# Patient Record
Sex: Female | Born: 1970 | Race: White | Hispanic: No | Smoking: Current every day smoker
Health system: Southern US, Community
[De-identification: ages and names within clinical notes are randomized; demographics above are authoritative.]

## PROBLEM LIST (undated history)

## (undated) DIAGNOSIS — F988 Other specified behavioral and emotional disorders with onset usually occurring in childhood and adolescence: Secondary | ICD-10-CM

## (undated) DIAGNOSIS — F32A Depression, unspecified: Secondary | ICD-10-CM

## (undated) DIAGNOSIS — M1990 Unspecified osteoarthritis, unspecified site: Secondary | ICD-10-CM

## (undated) DIAGNOSIS — F319 Bipolar disorder, unspecified: Secondary | ICD-10-CM

## (undated) DIAGNOSIS — F410 Panic disorder [episodic paroxysmal anxiety] without agoraphobia: Secondary | ICD-10-CM

## (undated) DIAGNOSIS — I10 Essential (primary) hypertension: Secondary | ICD-10-CM

## (undated) HISTORY — PX: CHOLECYSTECTOMY: SHX55

## (undated) HISTORY — PX: ABDOMINAL HYSTERECTOMY: SHX81

## (undated) HISTORY — DX: Depression, unspecified: F32.A

---

## 2002-07-25 ENCOUNTER — Other Ambulatory Visit: Admission: RE | Admit: 2002-07-25 | Discharge: 2002-07-25 | Payer: Self-pay | Admitting: Internal Medicine

## 2002-12-02 ENCOUNTER — Emergency Department (HOSPITAL_COMMUNITY): Admission: EM | Admit: 2002-12-02 | Discharge: 2002-12-02 | Payer: Self-pay

## 2004-06-24 ENCOUNTER — Emergency Department: Payer: Self-pay | Admitting: Emergency Medicine

## 2005-06-28 ENCOUNTER — Emergency Department: Payer: Self-pay | Admitting: General Practice

## 2006-09-22 ENCOUNTER — Ambulatory Visit: Payer: Self-pay

## 2006-11-15 ENCOUNTER — Ambulatory Visit: Payer: Self-pay

## 2007-04-11 ENCOUNTER — Emergency Department: Payer: Self-pay | Admitting: Emergency Medicine

## 2007-04-22 ENCOUNTER — Emergency Department: Payer: Self-pay | Admitting: Emergency Medicine

## 2007-05-07 ENCOUNTER — Emergency Department: Payer: Self-pay | Admitting: Emergency Medicine

## 2007-05-16 ENCOUNTER — Ambulatory Visit: Payer: Self-pay | Admitting: Family Medicine

## 2007-05-30 ENCOUNTER — Ambulatory Visit: Payer: Self-pay | Admitting: Pain Medicine

## 2007-06-14 ENCOUNTER — Ambulatory Visit: Payer: Self-pay | Admitting: Pain Medicine

## 2007-07-11 ENCOUNTER — Ambulatory Visit: Payer: Self-pay | Admitting: Physician Assistant

## 2007-08-23 ENCOUNTER — Other Ambulatory Visit: Payer: Self-pay

## 2007-08-23 ENCOUNTER — Inpatient Hospital Stay: Payer: Self-pay | Admitting: Internal Medicine

## 2007-09-01 ENCOUNTER — Ambulatory Visit: Payer: Self-pay | Admitting: Physician Assistant

## 2007-12-20 ENCOUNTER — Emergency Department: Payer: Self-pay | Admitting: Emergency Medicine

## 2007-12-21 ENCOUNTER — Inpatient Hospital Stay: Payer: Self-pay | Admitting: Psychiatry

## 2009-02-19 IMAGING — US US EXTREM LOW VENOUS*L*
1 series · 17 of 20 positions shown · non-contrast
Comparison: none

REASON FOR EXAM: LEFT lower extremity edema pain  eval DVT
COMMENTS:  Call report Dr Giannopoulos [phone]

PROCEDURE:     US  - US DOPPLER LOW EXTR LEFT  - September 22, 2006  [DATE]
RESULT:     The phasic and augmentation flow waveforms are normal.  The LEFT
femoral and popliteal veins show normal compressibility.  Doppler
examination shows no deep venous occlusion.

[Series 1: us extrem low venous*left* · 17 of 20 slices shown]
[im 1/20]
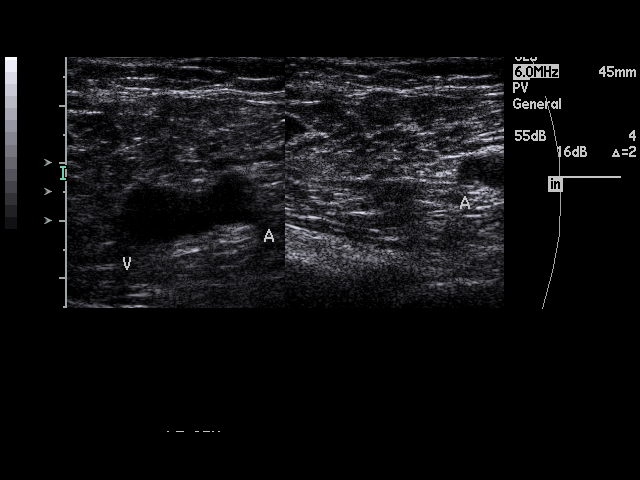
[im 2/20]
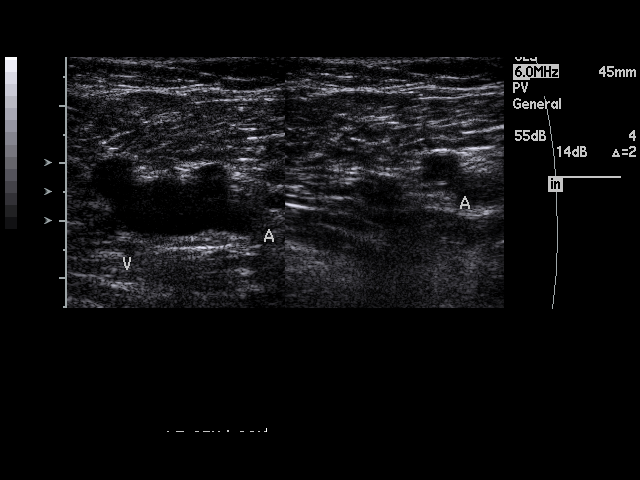
[im 3/20]
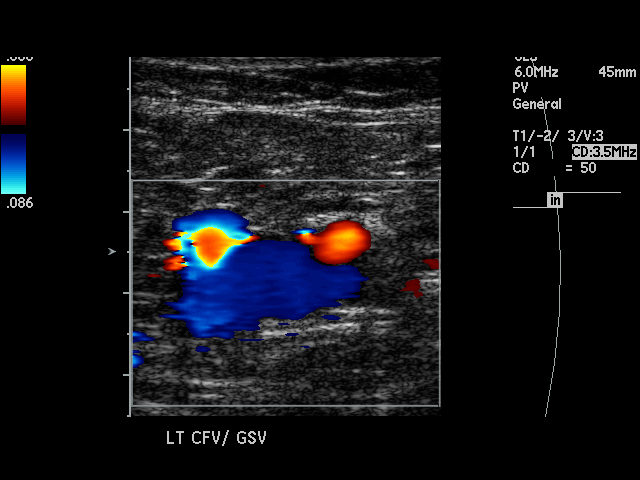
[im 5/20]
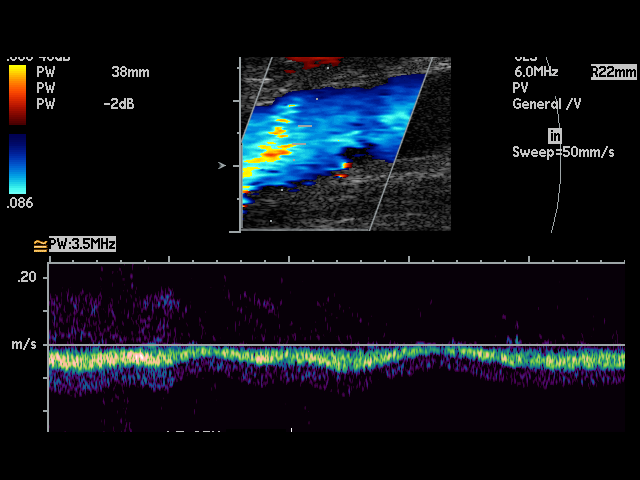
[im 6/20]
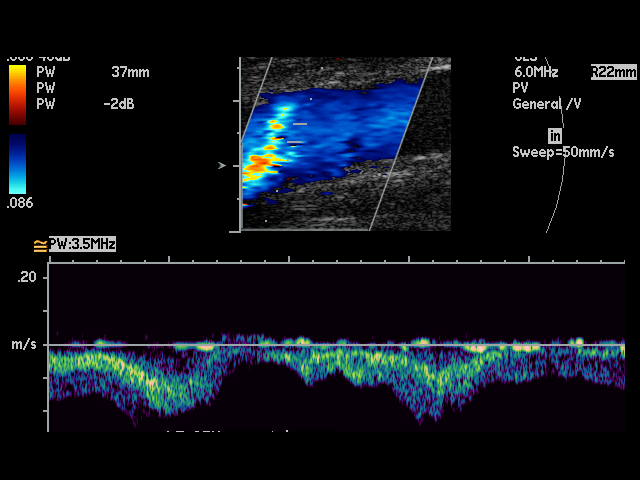
[im 7/20]
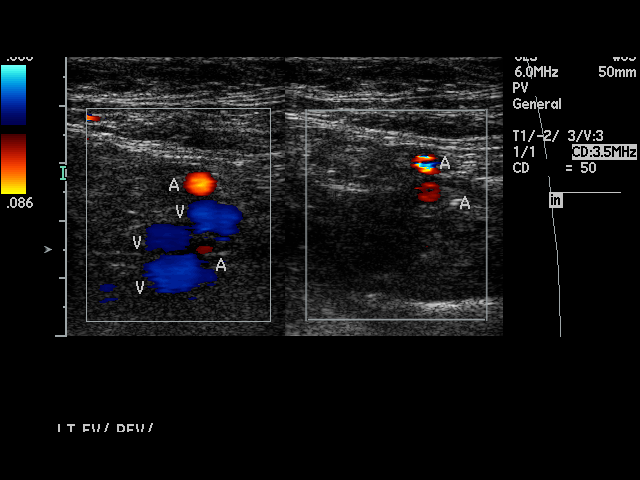
[im 8/20]
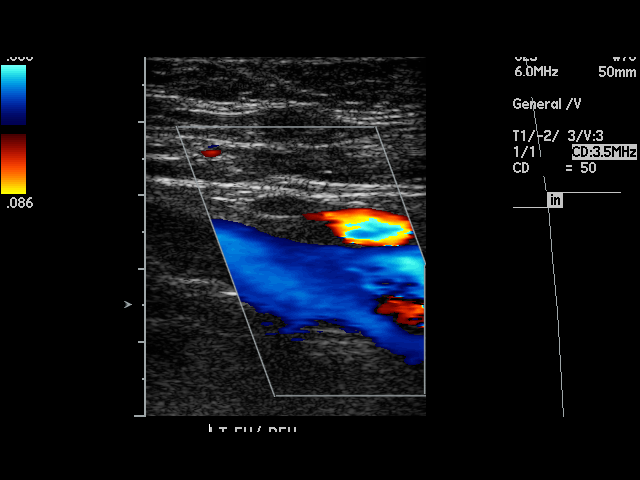
[im 9/20]
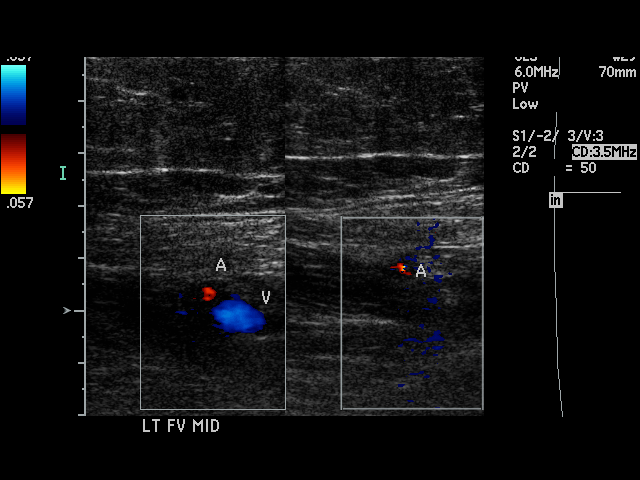
[im 11/20]
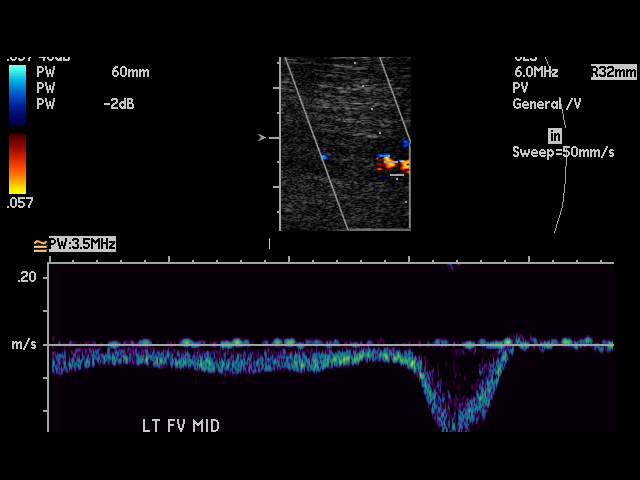
[im 12/20]
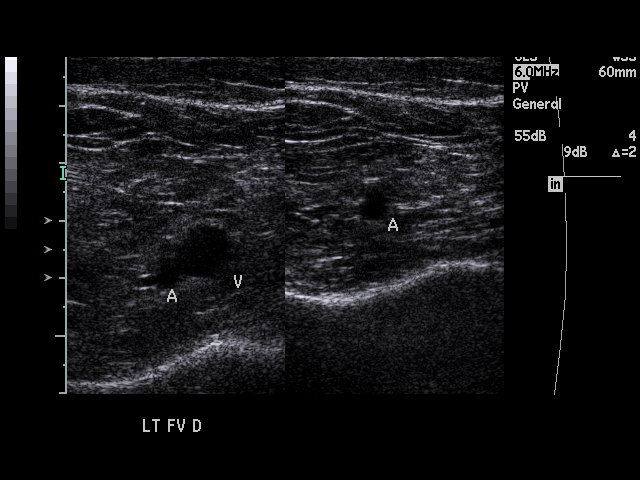
[im 13/20]
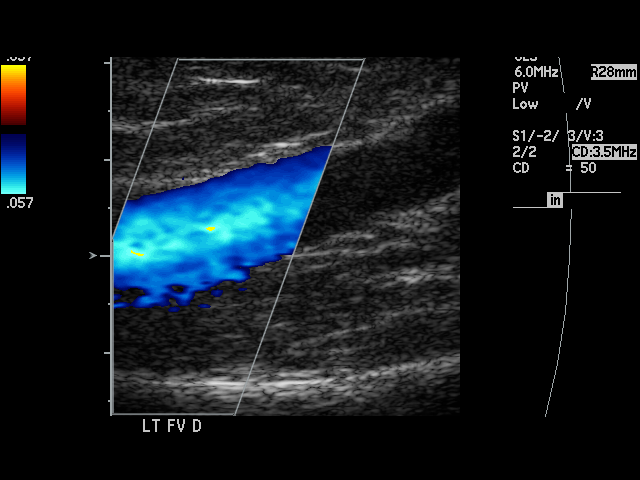
[im 14/20]
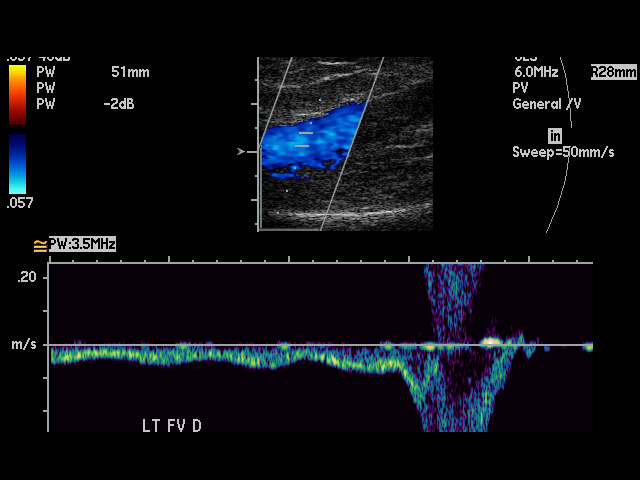
[im 15/20]
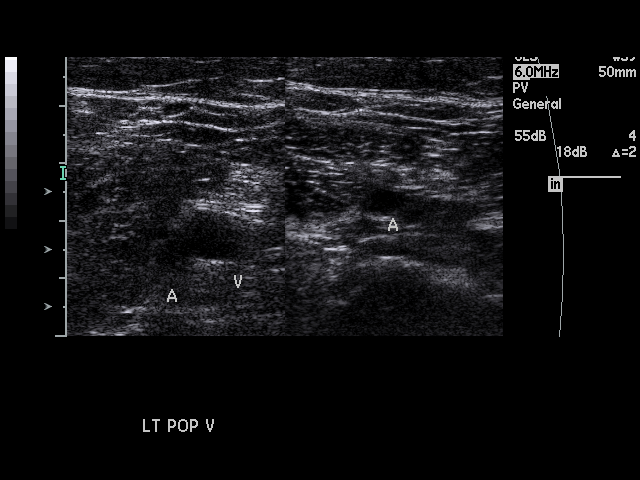
[im 16/20]
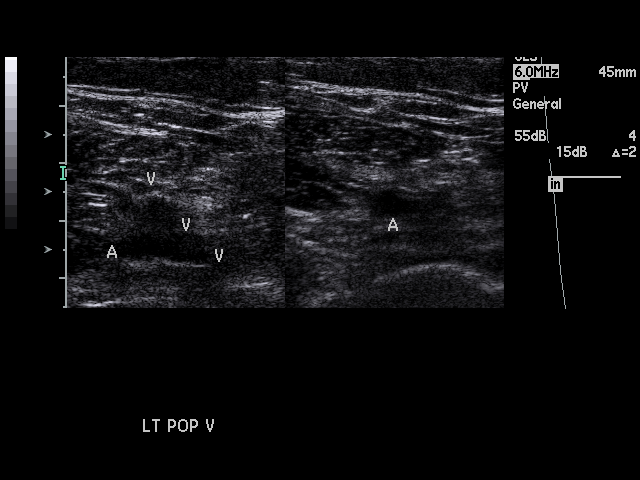
[im 18/20]
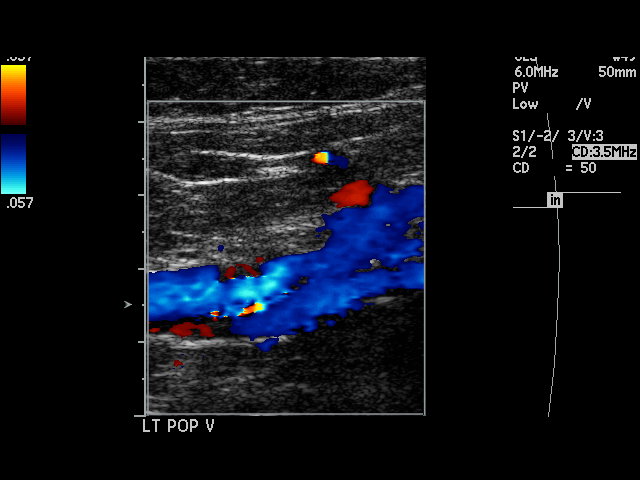
[im 19/20]
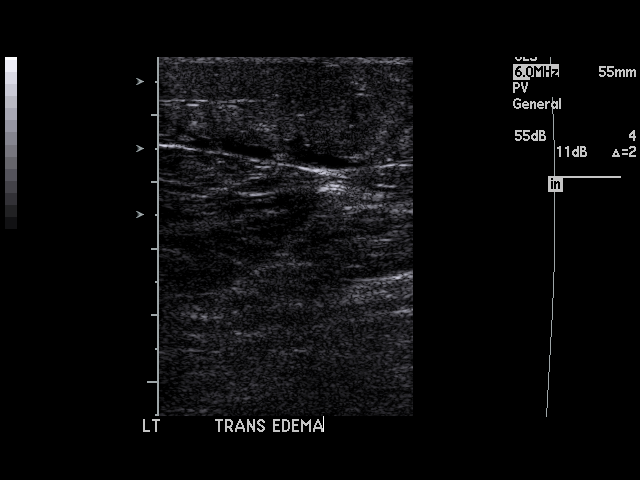
[im 20/20]
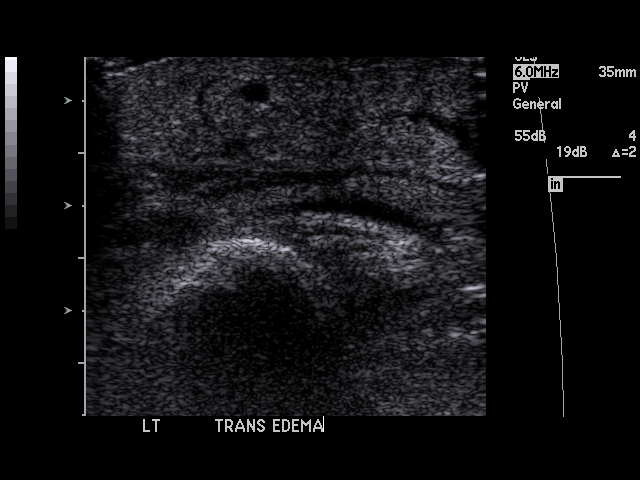

[17 of 20 positions shown; findings below may reference images not displayed]

IMPRESSION: 1.     Normal study.
2.     No deep venous thrombosis is identified in the LEFT lower extremity.

## 2009-02-22 ENCOUNTER — Inpatient Hospital Stay: Payer: Self-pay | Admitting: *Deleted

## 2009-04-18 ENCOUNTER — Ambulatory Visit: Payer: Self-pay | Admitting: Internal Medicine

## 2009-05-05 ENCOUNTER — Emergency Department: Payer: Self-pay | Admitting: Emergency Medicine

## 2009-07-10 ENCOUNTER — Encounter: Payer: Self-pay | Admitting: Anesthesiology

## 2009-08-06 ENCOUNTER — Encounter: Payer: Self-pay | Admitting: Anesthesiology

## 2009-10-02 ENCOUNTER — Emergency Department: Payer: Self-pay | Admitting: Emergency Medicine

## 2010-03-10 ENCOUNTER — Emergency Department: Payer: Self-pay | Admitting: Unknown Physician Specialty

## 2010-05-04 ENCOUNTER — Inpatient Hospital Stay: Payer: Self-pay | Admitting: Psychiatry

## 2011-05-13 ENCOUNTER — Inpatient Hospital Stay: Payer: Self-pay | Admitting: Psychiatry

## 2011-06-05 ENCOUNTER — Emergency Department: Payer: Self-pay | Admitting: Internal Medicine

## 2011-07-15 ENCOUNTER — Emergency Department: Payer: Self-pay | Admitting: Emergency Medicine

## 2011-07-29 ENCOUNTER — Emergency Department (HOSPITAL_COMMUNITY)
Admission: EM | Admit: 2011-07-29 | Discharge: 2011-07-29 | Disposition: A | Discharge status: Home / Self Care | Payer: Medicaid Other | Attending: Emergency Medicine | Admitting: Emergency Medicine

## 2011-07-29 ENCOUNTER — Encounter (HOSPITAL_COMMUNITY): Payer: Self-pay

## 2011-07-29 ENCOUNTER — Emergency Department (HOSPITAL_COMMUNITY): Payer: Medicaid Other

## 2011-07-29 DIAGNOSIS — R29898 Other symptoms and signs involving the musculoskeletal system: Secondary | ICD-10-CM | POA: Insufficient documentation

## 2011-07-29 DIAGNOSIS — M779 Enthesopathy, unspecified: Secondary | ICD-10-CM

## 2011-07-29 DIAGNOSIS — M5430 Sciatica, unspecified side: Secondary | ICD-10-CM | POA: Insufficient documentation

## 2011-07-29 DIAGNOSIS — M545 Low back pain, unspecified: Secondary | ICD-10-CM | POA: Insufficient documentation

## 2011-07-29 DIAGNOSIS — M79609 Pain in unspecified limb: Secondary | ICD-10-CM | POA: Insufficient documentation

## 2011-07-29 DIAGNOSIS — R32 Unspecified urinary incontinence: Secondary | ICD-10-CM | POA: Insufficient documentation

## 2011-07-29 DIAGNOSIS — M5432 Sciatica, left side: Secondary | ICD-10-CM

## 2011-07-29 DIAGNOSIS — R209 Unspecified disturbances of skin sensation: Secondary | ICD-10-CM | POA: Insufficient documentation

## 2011-07-29 DIAGNOSIS — M898X9 Other specified disorders of bone, unspecified site: Secondary | ICD-10-CM | POA: Insufficient documentation

## 2011-07-29 MED ORDER — KETOROLAC TROMETHAMINE 60 MG/2ML IM SOLN: 60.0000 mg | Freq: Once | INTRAMUSCULAR | Status: DC

## 2011-07-29 MED ORDER — KETOROLAC TROMETHAMINE 60 MG/2ML IM SOLN
30.0000 mg | Freq: Once | INTRAMUSCULAR | Status: AC
  Administered 2011-07-29: 30 mg via INTRAMUSCULAR
  Filled 2011-07-29: qty 2

## 2011-07-29 MED ORDER — OXYCODONE-ACETAMINOPHEN 5-325 MG PO TABS
1.0000 | ORAL_TABLET | Freq: Once | ORAL | Status: DC
  Filled 2011-07-29: qty 1

## 2011-07-29 MED ORDER — OXYCODONE-ACETAMINOPHEN 5-325 MG PO TABS: 2.0000 | ORAL_TABLET | ORAL | Status: AC | PRN

## 2011-07-29 NOTE — ED Provider Notes (Signed)
History     CSN: [id number]  Arrival date & time 07/29/11  0820   First MD Initiated Contact with Patient 07/29/11 [phone]      Chief Complaint  Patient presents with  . Foot Pain    (Consider location/radiation/quality/duration/timing/severity/associated sxs/prior treatment) HPI History provided by pt.   Pt c/o pain in left foot x approx 2 weeks.  Reports that pain is located in ball of foot but has padding on her heel and was recently given a clinical diagnosis of heel spur.  Only occurs when she is bearing weight.  Denies trauma.  Onset acute exacerbation of stabbing left sciatica, from mid-line low back down lateral aspect of LLE, at same time.  She is unsure if pain in foot is radiated from back or unrelated.  Back pain associated w/ weakness in LLE that has resulted in falls, urinary incontinence and paresthesias.  Has had these sx w/ past exacerbations as well, the most recent being approx 1 year ago.  No fever.   Has been taking aleve and motrin w/out relief and these medications are causing her to have diarrhea and hemorrhoids.    No past medical history on file.  No past surgical history on file.  No family history on file.  History  Substance Use Topics  . Smoking status: Current Everyday Smoker  . Smokeless tobacco: Not on file  . Alcohol Use: No    OB History    Grav Para Term Preterm Abortions TAB SAB Ect Mult Living                  Review of Systems  All other systems reviewed and are negative.    Allergies  Review of patient's allergies indicates no known allergies.  Home Medications   Current Outpatient Rx  Name Route Sig Dispense Refill  . ALPRAZOLAM 1 MG PO TABS Oral Take 1 mg by mouth 3 (three) times daily as needed. Stomach nerves    . IBUPROFEN 200 MG PO TABS Oral Take 400 mg by mouth every 6 (six) hours as needed. For pain    . LISDEXAMFETAMINE DIMESYLATE 30 MG PO CAPS Oral Take 60 mg by mouth See admin instructions. Just on days she goes to  school    . NAPROXEN SODIUM 220 MG PO TABS Oral Take 220 mg by mouth 3 (three) times daily as needed. For pain    . OVER THE COUNTER MEDICATION Topical Apply 1 patch topically daily as needed. Foot pain    . PE-SHARK LIVER OIL-COCOA BUTTR 0.25-3-85.5 % RE SUPP Rectal Place 1 suppository rectally as needed. hemmeroids      BP 137/84  Pulse 102  Temp(Src) 98 F (36.7 C) (Oral)  Resp 18  Ht 5\' 5"  (1.651 m)  Wt 220 lb (99.791 kg)  BMI 36.61 kg/m2  SpO2 100%  Physical Exam  Nursing note and vitals reviewed. Constitutional: She is oriented to person, place, and time. She appears well-developed and well-nourished. No distress.  HENT:  Head: Normocephalic and atraumatic.  Eyes:       Normal appearance  Neck: Normal range of motion.  Cardiovascular: Normal rate, regular rhythm and intact distal pulses.   Pulmonary/Chest: Effort normal and breath sounds normal.  Musculoskeletal: Normal range of motion.       Entire lumbar spine mildly ttp. No CVA ttp. Full ROM of LE.  Nml patellar reflexes.  Normal rectal tone.  No saddle anesthesia. Distal sensation intact.  2+ DP pulses.  Pt has a  padded material on plantar surface of heel.  No skin changes. Entire foot non-tender.  No pain w/ passive ROM of ankle.  Active ROM of all toes.    Neurological: She is alert and oriented to person, place, and time. She has normal reflexes. She displays no tremor. No cranial nerve deficit or sensory deficit. She displays a negative Romberg sign. Coordination and gait normal.       5/5 and equal upper and lower extremity strength.  No past pointing.  No pronator drift.  No nystagmus.   Skin: Skin is warm and dry. No rash noted.  Psychiatric: She has a normal mood and affect. Her behavior is normal.    ED Course  Procedures (including critical care time)  Labs Reviewed - No data to display Dg Foot Complete Left  07/29/2011  *RADIOLOGY REPORT*  Clinical Data: Heel pain.  No injury.  LEFT FOOT - COMPLETE 3+ VIEW   Comparison: None.  Findings: Plantar calcaneal spur present. No acute bony abnormality.  Specifically, no fracture, subluxation, or dislocation.  Soft tissues are intact.  IMPRESSION: Plantar calcaneal spur.  No acute findings.  Original Report Authenticated By: Cyndie Chime, M.D.     1. Sciatica of left side   2. Bone spur       MDM  Pt presents w/ cc non-traumatic pain plantar surface left foot when she bears weight x 2 wks.  PCP clinically diagnosed her w/ bone spur.  Onset left sciatica exacerbation simultaneously and she is unsure if foot pain is related.  Subjective urinary incontinence and LLE weakness but on exam, she has 5/5 strength, no sensory deficits (including saddle anesthesia) and normal rectal tone.  Low suspicion for cauda equina syndrome.  Pt is receiving IM toradol.Will reassess shortly.    Xray of foot shows plantar calcaneal spur.  This or less likely sciatica may be responsible for foot pain.  Discussed w/ pt. She reports feeling better after receiving toradol and she is ambulating w/out difficulty.  Nursing staff provided pt w/ an ace bandage as requested.  Prescribed percocet.  Recommended that she continue her NSAID, avoid activities that aggravate pain and f/u with neuro and/or ortho for back and persistent foot pain.  Gave her contact information for a pain specialist as well. Return precautions discussed.  11:05 AM         Otilio Miu, PA 07/29/11 1110

## 2011-07-30 NOTE — ED Provider Notes (Signed)
Medical screening examination/treatment/procedure(s) were performed by non-physician practitioner and as supervising physician I was immediately available for consultation/collaboration.  Fabiana Dromgoole, MD 07/30/11 0750 

## 2011-08-12 ENCOUNTER — Ambulatory Visit: Payer: Medicaid Other | Admitting: Physical Therapy

## 2011-09-28 ENCOUNTER — Inpatient Hospital Stay: Payer: Self-pay | Admitting: Unknown Physician Specialty

## 2011-09-28 LAB — SALICYLATE LEVEL: Salicylates, Serum: 2.4 mg/dL

## 2011-09-28 LAB — URINALYSIS, COMPLETE
Bilirubin,UR: NEGATIVE
Glucose,UR: NEGATIVE mg/dL (ref 0–75)
Hyaline Cast: 3
Leukocyte Esterase: NEGATIVE
Ph: 5 (ref 4.5–8.0)
Specific Gravity: 1.01 (ref 1.003–1.030)

## 2011-09-28 LAB — COMPREHENSIVE METABOLIC PANEL
BUN: 8 mg/dL (ref 7–18)
Co2: 31 mmol/L (ref 21–32)
Creatinine: 0.89 mg/dL (ref 0.60–1.30)
EGFR (Non-African Amer.): >60
Glucose: 115 mg/dL — ABNORMAL HIGH (ref 65–99)
SGOT(AST): 42 U/L — ABNORMAL HIGH (ref 15–37)
Sodium: 143 mmol/L (ref 136–145)
Total Protein: 7.2 g/dL (ref 6.4–8.2)

## 2011-09-28 LAB — TSH: Thyroid Stimulating Horm: 3.38 u[IU]/mL

## 2011-09-28 LAB — CBC
HCT: 37.2 % (ref 35.0–47.0)
HGB: 12.3 g/dL (ref 12.0–16.0)
MCH: 26.5 pg (ref 26.0–34.0)
RBC: 4.64 10*6/uL (ref 3.80–5.20)
RDW: 15.1 % — ABNORMAL HIGH (ref 11.5–14.5)

## 2011-09-28 LAB — DRUG SCREEN, URINE
Barbiturates, Ur Screen: NEGATIVE (ref ?–200)
Cocaine Metabolite,Ur ~~LOC~~: NEGATIVE (ref ?–300)
Phencyclidine (PCP) Ur S: NEGATIVE (ref ?–25)

## 2011-09-28 LAB — PREGNANCY, URINE: Pregnancy Test, Urine: NEGATIVE m[IU]/mL

## 2011-10-14 ENCOUNTER — Emergency Department: Payer: Self-pay | Admitting: Emergency Medicine

## 2011-11-13 ENCOUNTER — Encounter (HOSPITAL_COMMUNITY): Payer: Self-pay

## 2011-11-13 ENCOUNTER — Emergency Department (HOSPITAL_COMMUNITY)
Admission: EM | Admit: 2011-11-13 | Discharge: 2011-11-13 | Disposition: A | Discharge status: Home / Self Care | Payer: Medicaid Other | Attending: Emergency Medicine | Admitting: Emergency Medicine

## 2011-11-13 ENCOUNTER — Emergency Department (HOSPITAL_COMMUNITY): Payer: Medicaid Other

## 2011-11-13 DIAGNOSIS — Z79899 Other long term (current) drug therapy: Secondary | ICD-10-CM | POA: Insufficient documentation

## 2011-11-13 DIAGNOSIS — W1800XA Striking against unspecified object with subsequent fall, initial encounter: Secondary | ICD-10-CM

## 2011-11-13 DIAGNOSIS — M25559 Pain in unspecified hip: Secondary | ICD-10-CM | POA: Insufficient documentation

## 2011-11-13 DIAGNOSIS — M545 Low back pain, unspecified: Secondary | ICD-10-CM

## 2011-11-13 DIAGNOSIS — R202 Paresthesia of skin: Secondary | ICD-10-CM

## 2011-11-13 DIAGNOSIS — I10 Essential (primary) hypertension: Secondary | ICD-10-CM | POA: Insufficient documentation

## 2011-11-13 DIAGNOSIS — R209 Unspecified disturbances of skin sensation: Secondary | ICD-10-CM | POA: Insufficient documentation

## 2011-11-13 DIAGNOSIS — F988 Other specified behavioral and emotional disorders with onset usually occurring in childhood and adolescence: Secondary | ICD-10-CM | POA: Insufficient documentation

## 2011-11-13 DIAGNOSIS — Y9229 Other specified public building as the place of occurrence of the external cause: Secondary | ICD-10-CM | POA: Insufficient documentation

## 2011-11-13 HISTORY — DX: Other specified behavioral and emotional disorders with onset usually occurring in childhood and adolescence: F98.8

## 2011-11-13 HISTORY — DX: Panic disorder (episodic paroxysmal anxiety): F41.0

## 2011-11-13 HISTORY — DX: Essential (primary) hypertension: I10

## 2011-11-13 MED ORDER — HYDROCODONE-ACETAMINOPHEN 5-325 MG PO TABS: 1.0000 | ORAL_TABLET | Freq: Four times a day (QID) | ORAL | Status: AC | PRN

## 2011-11-13 MED ORDER — HYDROCODONE-ACETAMINOPHEN 5-325 MG PO TABS
1.0000 | ORAL_TABLET | Freq: Once | ORAL | Status: AC
  Administered 2011-11-13: 1 via ORAL
  Filled 2011-11-13: qty 1

## 2011-11-13 NOTE — ED Notes (Signed)
Patient ambulatory in department.
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
## 2011-11-13 NOTE — ED Provider Notes (Signed)
Medical screening examination/treatment/procedure(s) were performed by non-physician practitioner and as supervising physician I was immediately available for consultation/collaboration.  Doug Sou, MD 11/13/11 4540

## 2011-11-13 NOTE — Discharge Instructions (Signed)
Please read the information below.  Continue to take aleve in addition to the prescribed pain medication while you heal.  You may also try cold compresses on the areas that are most painful.  If you continue to have significant pain next week, please see your primary care provider.  If you develop uncontrolled pain, loss of control of bowel or bladder or are unable to urinate, have weakness in your legs or are unable to walk, go to your nearest emergency department immediately.  You may return to the ER at any time for worsening condition or any new symptoms that concern you.   Back Pain, Adult Back pain is very common. The pain often gets better over time. The cause of back pain is usually not dangerous. Most people can learn to manage their back pain on their own.  HOME CARE   Stay active. Start with short walks on flat ground if you can. Try to walk farther each day.   Do not sit, drive, or stand in one place for more than 30 minutes. Do not stay in bed.   Do not avoid exercise or work. Activity can help your back heal faster.   Be careful when you bend or lift an object. Bend at your knees, keep the object close to you, and do not twist.   Sleep on a firm mattress. Lie on your side, and bend your knees. If you lie on your back, put a pillow under your knees.   Only take medicines as told by your doctor.   Put ice on the injured area.   Put ice in a plastic bag.   Place a towel between your skin and the bag.   Leave the ice on for 15 to 20 minutes, 3 to 4 times a day for the first 2 to 3 days. After that, you can switch between ice and heat packs.   Ask your doctor about back exercises or massage.   Avoid feeling anxious or stressed. Find good ways to deal with stress, such as exercise.  GET HELP RIGHT AWAY IF:   Your pain does not go away with rest or medicine.   Your pain does not go away in 1 week.   You have new problems.   You do not feel well.   The pain spreads into  your legs.   You cannot control when you poop (bowel movement) or pee (urinate).   Your arms or legs feel weak or lose feeling (numbness).   You feel sick to your stomach (nauseous) or throw up (vomit).   You have belly (abdominal) pain.   You feel like you may pass out (faint).  MAKE SURE YOU:   Understand these instructions.   Will watch your condition.   Will get help right away if you are not doing well or get worse.  Document Released: 12/09/2007 Document Revised: 06/11/2011 Document Reviewed: 11/10/2010 [facility] Patient Information 2012 [place], [place].  Paresthesia Paresthesia is a burning or prickling feeling. This feeling can happen in any part of the body. It often happens in the hands, arms, legs, or feet. HOME CARE  Avoid drinking alcohol.   Try massage or needle therapy (acupuncture) to help with your problems.   Keep all doctor visits as told.  GET HELP RIGHT AWAY IF:   You feel weak.   You have trouble walking or moving.   You have problems speaking or seeing.   You feel confused.   You cannot control when you  poop (bowel movement) or pee (urinate).   You lose feeling (numbness) after an injury.   You pass out (faint).   Your burning or prickling feeling gets worse when you walk.   You have pain, cramps, or feel dizzy.   You have a rash.  MAKE SURE YOU:   Understand these instructions.   Will watch your condition.   Will get help right away if you are not doing well or get worse.  Document Released: 06/04/2008 Document Revised: 06/11/2011 Document Reviewed: 03/13/2011 [facility] Patient Information 2012 [place], [place].  If you have no primary doctor, here are some resources that may be helpful:  Medicaid-accepting [facility] Providers:   - [facility]- [address]      [phone]      Mon-Fri 9am-7pm, Sat 9am-1pm   - [facility]- [address] [place]      [phone]   -  [facility]- [address] [place]      [phone]   [facility]- [address]      [phone]   - Renaye Rakers- [address] [place], [address]      [phone]      Only accepts [place] patients       after they have her name applied to their card   Self Pay (no insurance) in [place]:   - Sickle Cell Patients: Dr Willey Blade, [facility]      [address] [place]      [phone]   - Health [phone]   - Physician Referral Service- [phone]   - [facility]- [address] [place]      [phone]   [facility] [place]- [address]   - [facility]- see information above      (Speak to [employer] if you do not have insurance)   - [facility]- [address] [place]      [phone]   - [facility] [place]- [address]      [phone]   - [facility]- [address]      [phone]   - Dr Julio Sicks-  [address], [place]      [phone]   - [facility]- [address]      [phone]   - [facility]- [address]      [phone]      Also [address]      [phone]   - [facility]- [address]      [phone]      1st and 3rd Saturday every month, 10am-1pm Other agencies that provide inexpensive medical care:    [facility]  [phone]    [facility]  [phone]    [facility]  [phone]    [facility]  [phone]    [facility]  [phone]  General Information: Finding a doctor when you do not have health insurance can be tricky. Although you are not limited by an insurance plan, you are of course limited by her finances and how much but he can pay out of pocket.  What are your options if you don't have health insurance?   1) Find a Librarian, academic and Pay Out  of Pocket Although you won't have to find out who is  covered by your insurance plan, it is a good idea to ask around and get recommendations. You will then need to call the office and see if the doctor you have chosen will accept you as a new patient and what types of options they offer for patients who are self-pay. Some doctors offer discounts or will set up payment plans for their patients who do not have insurance, but you will need to ask so you aren't surprised when you get to your appointment.  2) Contact Your Local Health Department Not all health departments have doctors that can see patients for sick visits, but many do, so it is worth a call to see if yours does. If you don't know where your local health department is, you can check in your phone book. The CDC also has a tool to help you locate your state's health department, and many state websites also have listings of all of their local health departments.  3) Find a Walk-in Clinic If your illness is not likely to be very severe or complicated, you may want to try a walk in clinic. These are popping up all over the country in pharmacies, drugstores, and shopping centers. They're usually staffed by nurse practitioners or physician assistants that have been trained to treat common illnesses and complaints. They're usually fairly quick and inexpensive. However, if you have serious medical issues or chronic medical problems, these are probably not your best option;  RESOURCE GUIDE  Dental Problems  Patients with Medicaid: [facility] [phone]                                           [phone] [address] [employer] Phone:  [phone]                                                  Phone:  [phone]  If unable to pay or uninsured, contact:  [facility] or [facility]. to become qualified for the adult dental clinic.  Chronic Pain Problems Contact [facility]  [phone] Patients need to be referred by  their primary care doctor.  Insufficient Money for Medicine Contact [employer]:  call "211" or [facility] [phone].  No Primary Care Doctor Call [facility]  [phone] Other agencies that provide inexpensive medical care    [facility]  [phone]    [facility]  [phone]    [facility]  [phone]    [facility]  [phone]    [facility]  [phone]    [facility]  [phone]  Psychological Services [facility]  [phone] [facility] Services  [phone] [facility]   [phone] (emergency services [phone])  Substance Abuse Resources Alcohol and Drug Services  [phone] [facility] [phone] [facility] [place] [phone] Floydene Flock [phone] Residential & Outpatient Substance Abuse Program  [phone]  Abuse/Neglect [facility] Child Abuse Hotline (703)189-9044 [facility] Child Abuse Hotline 857-385-5526 (After Hours)  Emergency Shelter  [employer] [phone]  Maternity Homes [facility] at [facility] [place] 850-653-6787 [facility] [phone]  MRSA Hotline #:   (279)421-6166    [facility] Resources  [facility] [place]     [employer]                          [facility] Dept. [address] [place]                       [address]      [address]  Blondell Reveal Phone:  [phone]                                   Phone:  [phone]                 Phone:  [phone]  [facility] Phone:  [phone]  [facility] Child Abuse Hotline 419-555-0464 [phone] (After Hours)

## 2011-12-05 ENCOUNTER — Encounter (HOSPITAL_COMMUNITY): Payer: Self-pay | Admitting: Emergency Medicine

## 2011-12-05 ENCOUNTER — Emergency Department (HOSPITAL_COMMUNITY)
Admission: EM | Admit: 2011-12-05 | Discharge: 2011-12-05 | Disposition: A | Discharge status: Home / Self Care | Payer: Medicaid Other | Source: Home / Self Care | Attending: Emergency Medicine | Admitting: Emergency Medicine

## 2011-12-05 DIAGNOSIS — M79609 Pain in unspecified limb: Secondary | ICD-10-CM

## 2011-12-05 DIAGNOSIS — M65839 Other synovitis and tenosynovitis, unspecified forearm: Secondary | ICD-10-CM

## 2011-12-05 DIAGNOSIS — M778 Other enthesopathies, not elsewhere classified: Secondary | ICD-10-CM

## 2011-12-05 DIAGNOSIS — M79673 Pain in unspecified foot: Secondary | ICD-10-CM

## 2011-12-05 MED ORDER — MELOXICAM 7.5 MG PO TABS: 7.5000 mg | ORAL_TABLET | Freq: Every day | ORAL | Status: DC

## 2011-12-05 NOTE — ED Notes (Signed)
Left foot pain since December 2012, diagnosed with spur.  Pain continues and with recent increase in work -working full time now for approx a month, pain in foot is worse.  Also c/o right hand numb and tingling for 4 months.  Patient reports her pcp is aware of both these complaints and is waiting orthopedic referral.

## 2011-12-05 NOTE — Discharge Instructions (Signed)
As discussed followup with your primary care Dr. in any way she can consult with a [facility] as you probably will benefit from a customized shoe insert for your heel spur

## 2011-12-05 NOTE — ED Notes (Signed)
Given ice water.

## 2011-12-05 NOTE — ED Provider Notes (Signed)
History     CSN: [id number]  Arrival date & time 12/05/11  1718   First MD Initiated Contact with Patient 12/05/11 1729      Chief Complaint  Patient presents with  . Foot Pain    (Consider location/radiation/quality/duration/timing/severity/associated sxs/prior treatment) HPI Comments: Continue to have this pain in my left heel for a long time and just exacerbated as I've been working more and now for the last month since is out of work and the pain in my foot is worse. Also have his right hand numbness and tingling and pain mostly on my right thumb for about 4 months, I am not wearing the splint today I left it at home my doctors are for me to see an orthopedic because of my and wrist pain. Could you  prescribed something for pain for the weekend"  Patient is a 41 y.o. female presenting with lower extremity pain. The history is provided by the patient.  Foot Pain This is a recurrent problem. The current episode started more than 1 week ago. The problem occurs constantly. The problem has not changed since onset.The symptoms are aggravated by walking. The symptoms are relieved by nothing. She has tried nothing for the symptoms.    Past Medical History  Diagnosis Date  . Hypertension   . Attention deficit disorder (ADD)   . Panic attacks     Past Surgical History  Procedure Date  . Cholecystectomy   . Abdominal hysterectomy     No family history on file.  History  Substance Use Topics  . Smoking status: Current Everyday Smoker  . Smokeless tobacco: Not on file  . Alcohol Use: No    OB History    Grav Para Term Preterm Abortions TAB SAB Ect Mult Living                  Review of Systems  Constitutional: Positive for activity change. Negative for fever, chills and unexpected weight change.  Musculoskeletal: Positive for gait problem.  Skin: Negative for color change, pallor, rash and wound.  Neurological: Positive for numbness. Negative for weakness.    Allergies    Review of patient's allergies indicates no known allergies.  Home Medications   Current Outpatient Rx  Name Route Sig Dispense Refill  . GABAPENTIN 100 MG PO CAPS Oral Take 100 mg by mouth 3 (three) times daily. Ran out of this medicine for a week, reports picked up medicine refill yesterday and started back on medicine    . ALPRAZOLAM 1 MG PO TABS Oral Take 1 mg by mouth 3 (three) times daily as needed. Stomach nerves    . LISDEXAMFETAMINE DIMESYLATE 30 MG PO CAPS Oral Take 60 mg by mouth See admin instructions. Just on days she goes to school    . LISINOPRIL 20 MG PO TABS Oral Take 20 mg by mouth daily.    . MELOXICAM 7.5 MG PO TABS Oral Take 1 tablet (7.5 mg total) by mouth daily. 14 tablet 0  . MELOXICAM 7.5 MG PO TABS Oral Take 1 tablet (7.5 mg total) by mouth daily. 14 tablet 0  . MIRTAZAPINE 30 MG PO TABS Oral Take 30 mg by mouth at bedtime.    . TRAMADOL HCL 50 MG PO TABS Oral Take 50 mg by mouth every 6 (six) hours as needed. For pain      BP 128/90  Pulse 126  Temp(Src) 99.5 F (37.5 C) (Oral)  Resp 24  SpO2 97%  Physical Exam  Nursing note and vitals reviewed. Constitutional: She appears well-developed and well-nourished.  Musculoskeletal: She exhibits tenderness.       Hands:      Feet:  Skin: No rash noted. No erythema.    ED Course  Procedures (including critical care time)  Labs Reviewed - No data to display No results found.   1. Foot pain   2. Tendinitis of hand       MDM  Patient with ongoing left subchondral spore pain. Right-sided wrist tenosynovitis has been attended by her primary care doctor. Patient was prescribed meloxicam course and was encouraged to followup with her doctor and to [facility]. Sugar treatment plan followup care with both of her foot Center in her Dr.        Jimmie Molly, MD 12/05/11 2036

## 2011-12-06 ENCOUNTER — Encounter (HOSPITAL_COMMUNITY): Payer: Self-pay | Admitting: *Deleted

## 2011-12-06 ENCOUNTER — Emergency Department (HOSPITAL_COMMUNITY)
Admission: EM | Admit: 2011-12-06 | Discharge: 2011-12-06 | Disposition: A | Discharge status: Home / Self Care | Payer: Medicaid Other | Attending: Emergency Medicine | Admitting: Emergency Medicine

## 2011-12-06 DIAGNOSIS — F988 Other specified behavioral and emotional disorders with onset usually occurring in childhood and adolescence: Secondary | ICD-10-CM | POA: Insufficient documentation

## 2011-12-06 DIAGNOSIS — M79673 Pain in unspecified foot: Secondary | ICD-10-CM

## 2011-12-06 DIAGNOSIS — F17200 Nicotine dependence, unspecified, uncomplicated: Secondary | ICD-10-CM | POA: Insufficient documentation

## 2011-12-06 DIAGNOSIS — I10 Essential (primary) hypertension: Secondary | ICD-10-CM | POA: Insufficient documentation

## 2011-12-06 DIAGNOSIS — M79609 Pain in unspecified limb: Secondary | ICD-10-CM | POA: Insufficient documentation

## 2011-12-06 MED ORDER — ACETAMINOPHEN-ASPIRIN BUFFERED 250-250 MG PO TABS: ORAL_TABLET | ORAL | Status: DC

## 2011-12-06 NOTE — Discharge Instructions (Signed)
Alternate Excedrin with your Mobic or your relief for pain. Consider the ice and stretch routine before bed however following up with a podiatrist in your primary care provider in the near future is very important for ongoing management of your foot pain. However return to ER for emergent changing or worsening of symptoms.

## 2011-12-06 NOTE — ED Provider Notes (Signed)
History     CSN: [id number]  Arrival date & time 12/06/11  0021   First MD Initiated Contact with Patient 12/06/11 [id number]      Chief Complaint  Patient presents with  . Foot Pain    (Consider location/radiation/quality/duration/timing/severity/associated sxs/prior treatment) HPI  Patient presents the emergency department complaining of a multiple month history of pain in her left heel that is associated with a Hill spur. Patient states she's been evaluated by her primary care provider numerous times for this pain without any relief of symptoms. Patient states that she desires to be referred to a podiatrist however her primary care but provider has failed to do this. Patient states she's been having ongoing persistent and unchanging pain for multiple months. Patient states the pain is aggravated very much by the fact that she stands on her feet for prolonged period of time for her job. She denies any swelling, or redness, or break in skin. Patient was seen at the urgent care just prior to arrival and was prescribed meloxicam for pain. Patient states she's been taking Aleve at home and "wants something much stronger." Patient has no other complaints.  Past Medical History  Diagnosis Date  . Hypertension   . Attention deficit disorder (ADD)   . Panic attacks     Past Surgical History  Procedure Date  . Cholecystectomy   . Abdominal hysterectomy     No family history on file.  History  Substance Use Topics  . Smoking status: Current Everyday Smoker  . Smokeless tobacco: Not on file  . Alcohol Use: No    OB History    Grav Para Term Preterm Abortions TAB SAB Ect Mult Living                  Review of Systems  All other systems reviewed and are negative.    Allergies  Review of patient's allergies indicates no known allergies.  Home Medications   Current Outpatient Rx  Name Route Sig Dispense Refill  . ALPRAZOLAM 1 MG PO TABS Oral Take 1 mg by mouth 3 (three) times  daily as needed. For Stomach nerves/anxiety    . GABAPENTIN 100 MG PO CAPS Oral Take 100 mg by mouth 3 (three) times daily. Ran out of this medicine for a week, reports picked up medicine refill yesterday and started back on medicine    . LISDEXAMFETAMINE DIMESYLATE 30 MG PO CAPS Oral Take 60 mg by mouth See admin instructions. Just on days she goes to school    . LISINOPRIL 20 MG PO TABS Oral Take 20 mg by mouth at bedtime.     Marland Kitchen MIRTAZAPINE 30 MG PO TABS Oral Take 30 mg by mouth at bedtime.    Marland Kitchen NAPROXEN SODIUM 220 MG PO TABS Oral Take 660 mg by mouth 3 (three) times daily as needed. For pain    . ACETAMINOPHEN-ASPIRIN BUFFERED 250-250 MG PO TABS  Take two tablets by mouth every 6 hours as needed for pain 30 tablet 0    BP 133/83  Pulse 112  Temp(Src) 98.8 F (37.1 C) (Oral)  Resp 18  SpO2 99%  Physical Exam  Constitutional: She is oriented to person, place, and time. She appears well-developed and well-nourished. No distress.  HENT:  Head: Normocephalic and atraumatic.  Eyes: Conjunctivae are normal.  Cardiovascular: Normal rate and regular rhythm.   Pulmonary/Chest: Effort normal.  Musculoskeletal: Normal range of motion. She exhibits tenderness. She exhibits no edema.  Right ankle: She exhibits swelling. tenderness.       TTP of left heel without skin changes, swelling, or break in skin. FROM of ankle with no pain. Good pedal pulse and cap refill.   Neurological: She is alert and oriented to person, place, and time.       Normal sensation of entire foot.   Skin: Skin is warm and dry. No rash noted. She is not diaphoretic. No erythema. No pallor.  Psychiatric: She has a normal mood and affect. Her behavior is normal.    ED Course  Procedures (including critical care time)  Patient had to be escorted out of ED after dispo to home was set because she was found to have numerous medical supplies and linens hidden in her purse.   Labs Reviewed - No data to display No results  found.   1. Foot pain       MDM  Multiple month history of left he'll pain patient states associated with a spur. No acute findings. No signs or symptoms cellulitis or infectious process. Statement patient need to followup with her primary care provider for further evaluation and management I gave her referral to a local podiatrist.        Drucie Opitz, PA 12/06/11 [phone]

## 2011-12-06 NOTE — ED Provider Notes (Signed)
Medical screening examination/treatment/procedure(s) were performed by non-physician practitioner and as supervising physician I was immediately available for consultation/collaboration.    Keagon Glascoe D Ronica Vivian, MD 12/06/11 0733 

## 2011-12-06 NOTE — ED Notes (Signed)
Pt was at Urgent Care 6/1 prior to coming to ED st[facility] was given Mobic but it does not help

## 2011-12-06 NOTE — ED Notes (Signed)
Lt heel pain since November 2012.  She has been diagnosed with a heel spur and aleve is not helping

## 2014-10-28 NOTE — H&P (Signed)
PATIENT NAME:  Sharon Walton, DOCTER MR#:  [id number] DATE OF BIRTH:  Jan 10, 1971  DATE OF ADMISSION:  09/28/2011  CHIEF COMPLAINT: "I told my family I would no longer be around and they misunderstood".  HISTORY OF PRESENT ILLNESS: This is about the third psychiatric hospitalization, third here, for this 44 year old white divorced female who is admitted from the Emergency Room. The patient reports that her father developed brain cancer about 3 or 4 weeks ago. Since that time she has been under increased stress. She reports some sadness but she is trying to deny depression at this point. She reports she has been sleeping okay, interest has been okay, energy is okay, concentration is okay. Appetite has been down. She is denying suicidal thoughts, however, she made the family absolutely think she was going to kill herself when she told them that she was leaving. She reports she wanted to go to [place] which does not make any sense with the fact that she is currently in school studying.   The patient also has a history of ADD with symptoms including easy distractibility which is somewhat better now, organization problems, sustaining attention and often she is from one uncompleted activity to another. All this is somewhat better on Vyvanse. The patient also has a history of panic attacks with symptoms including fear, shortness of breath, palpitations, sweating, feeling like she is dying and losing control. She has agoraphobia with fear of crowds. She has had two in the last month. History of depression in the past which she is denying at present, however, she has been upset and under some distress recently secondary to her father's cancer. Her father previously had been receiving her SSI check and currently, though, the brain cancer has rendered him incapable of giving her the money so she is having some trouble with that.   MEDICATIONS: Currently the patient is on: 1. Xanax 1 mg 4 times a day as  needed. 2. Vyvanse 60 mg daily. She does not take it except when she goes to school.  3. Seroquel which has been discontinued because it caused akathisia. 4. Tramadol 50 mg 3 times a day p.r.n.  5. Remeron 30 mg at bedtime which she reports has not done anything to help her sleep. She has just recently been placed on that by Dr. Kasandra Knudsen who she sees.   FAMILY HISTORY: A cousin was hospitalized psychiatrically, diagnosis not known. Father alcoholic, otherwise negative.   PAST MEDICAL HISTORY: She has chronic back pain for which she is on tramadol. She has had a hysterectomy and cholecystectomy. Currently has a urinary tract infection and has a history of recurrent lymphedema of the lower extremities which is precipitated usually by stress.   REVIEW OF SYSTEMS: Negative. She wears glasses. EYES, EARS, AND THROAT: Otherwise negative. RESPIRATORY: Negative. CARDIOVASCULAR: Negative. GI: Negative. GU: Negative. UROLOGICAL: Some burning on urination. She currently does have a urinary tract infection. NEUROLOGICAL: Negative except for the stiff back.   SOCIAL HISTORY: The patient lives with friends occasionally but sometimes in the shelter and reports she's going to live in the shelter now. She was somewhat unclear about that. She is attending [employer] in [place] and drives there every day for [place] and currently has one year of college. She has one 51 year old son that she does not see who lives with the father. She is divorced. She smokes 3 or 4 cigarettes per day. No alcohol. She has a history of abusing drugs in the past. Reports last marijuana abuse  was two weeks ago, cocaine sometime in the more distant past past that.   MENTAL STATUS: Mental status on admission revealed a white female who looked her stated age. She was cooperative with me and not as agitated as reported earlier, however, she tended to minimize what she had done and how someone could interpret what she said as being suicidal.  Also, she was not able to explain to me why she wanted to move to [place] when she reported continuing wanting to go to school. She did, however, admit to the problems with some sadness over her father's brain cancer. She was aware of her surroundings, able to sustain attention and change focus of attention. There were no hallucinations or delusions. Insight and judgment was marginal. She was oriented x4, knew the presidents backwards x3, remembered 3 of 3 objects at one and three minutes.   PHYSICAL EXAMINATION:  HEENT: Head is normocephalic. Pupils equal, round, and reactive to light and accommodation. Throat clear.   NECK: Supple without masses.   CHEST: Clear.   CARDIAC: Normal sinus rhythm with normal S1, S2 without murmur or gallop. Good carotid and pedal pulses.   ABDOMEN: Slightly obese, soft without tenderness, masses, or organomegaly. No flank tenderness.   EXTREMITIES: No limitation of motion.   NEUROLOGICAL: Cranial nerves II through XII grossly intact. Fair gait with a slight limp but could do heel walk, toe walk, and straight line walk. Fair finger-to-nose. Deep tendon reflexes were 1 to 2+ and symmetrical with no pathological reflexes.   IMPRESSION:  AXIS I:  1. 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  2. Panic disorder with some agoraphobia. She is agoraphobic for crowds. 3. ADD, inattentive type.   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   AXIS III:  1. Chronic low back pain. 2. History of lymphedema. 3. Urinary tract infection.   AXIS IV: Difficult living situation circumstances at present.   AXIS V: 30. The patient is depressed with suicidal threats.   ASSESSMENT AND PLAN:  The patient will be placed back on Vyvanse.  May try to use trazodone for sleep rather than Remeron.  Place initially on suicide precautions.   ____________________________ Tanna Furry, MD wjr:drc D: 09/28/2011 15:19:01  ET T: 09/28/2011 15:58:34 ET JOB#: [id number]  cc: Tanna Furry, MD, <Dictator> Lew Dawes MD ELECTRONICALLY SIGNED 09/30/2011 16:44

## 2014-10-28 NOTE — Discharge Summary (Signed)
PATIENT NAME:  Sharon Walton, Sharon Walton MR#:  [id number] DATE OF BIRTH:  Sep 15, 1970  DATE OF ADMISSION:  09/28/2011 DATE OF DISCHARGE:  10/01/2011  HISTORY OF PRESENT ILLNESS: The patient was admitted with depressive disorder, not otherwise specified. For further details, please see typed attached History and Physical.  LABORATORY, DIAGNOSTIC AND RADIOLOGICAL DATA:  Acetaminophen negative.  Glucose slightly up at 115, BUN, creatinine, and electrolytes within normal limits, slightly low potassium at 3.3.  Alkaline phosphatase 162, otherwise liver function tests within normal limits, and SGOT slightly up at 42.  Ethanol blood level was negative.  CBC is within normal limits.  Salicylates are negative.  TSH 3.38, within normal limits.  Urine drug screen positive for amphetamines, which she was on, opiates, cannabinoids and benzodiazepine. She was on benzodiazepine also.  Pregnancy test was negative.  Arterial blood gases showed pO2 67, pCO2 47.  A chest x-ray showed no significant abnormalities.   HOSPITAL COURSE: Initially the patient did remain somewhat agitated, however, she attended groups and did fairly well. She did not appear overly depressed. We did history from the family which revealed a significant substance abuse history, particularly cocaine at times, some narcotics. She was given tramadol, however, for back pain. She may have sold them at times. However, the patient had been taking Xanax, and it was just therefore reduced during the hospitalization, and she did complain severely of panic attacks.   The family refused to let her go home, but she was comfortable going to the shelter, finding her own place, and did have a car and transportation. There was very little acting out during the hospitalization, and she continued to maintain no suicidal thinking and did not appear overly depressed.    DIAGNOSES:  AXIS I:  1. Depressive disorder, not otherwise specified.  2. Attention deficit  disorder  3. Panic disorder with some agoraphobia  4. Substance abuse, including cocaine abuse and perhaps some narcotic abuse.   AXIS II: Personality disorder, not otherwise specified.   AXIS III:  1. Chronic low back pain.  2. History of lymphedema.   CONDITION ON DISCHARGE: Good.   DISPOSITION: The patient will return to see Dr. Kasandra Knudsen. She will live in the shelter, at least temporarily, and she does plan to go back to school.   DISCHARGE MEDICATIONS:  1. Remeron 30 mg at bedtime. 2. Vyvanse 60 mg daily on the days she goes to school. Prescription not given for Vyvanse. 3. Xanax, reduced to 1 mg b.i.d. as needed. 4. Ultram 100 mg every 6 hours p.r.n. pain   DIET: As tolerated.    ACTIVITY: As tolerated.  ____________________________ Tanna Furry, MD wjr:cbb D: 09/30/2011 12:45:43 ET T: 09/30/2011 13:34:12 ET JOB#: [id number]  cc: Tanna Furry, MD, <Dictator> Lew Dawes MD ELECTRONICALLY SIGNED 09/30/2011 16:45

## 2017-04-05 ENCOUNTER — Encounter (HOSPITAL_COMMUNITY): Payer: Self-pay | Admitting: Emergency Medicine

## 2017-04-05 ENCOUNTER — Emergency Department (HOSPITAL_COMMUNITY)
Admission: EM | Admit: 2017-04-05 | Discharge: 2017-04-05 | Disposition: A | Discharge status: Home / Self Care | Payer: Self-pay | Attending: Physician Assistant | Admitting: Physician Assistant

## 2017-04-05 DIAGNOSIS — G8929 Other chronic pain: Secondary | ICD-10-CM | POA: Insufficient documentation

## 2017-04-05 DIAGNOSIS — Z79899 Other long term (current) drug therapy: Secondary | ICD-10-CM | POA: Insufficient documentation

## 2017-04-05 DIAGNOSIS — F17200 Nicotine dependence, unspecified, uncomplicated: Secondary | ICD-10-CM | POA: Insufficient documentation

## 2017-04-05 DIAGNOSIS — I10 Essential (primary) hypertension: Secondary | ICD-10-CM | POA: Insufficient documentation

## 2017-04-05 DIAGNOSIS — M5441 Lumbago with sciatica, right side: Secondary | ICD-10-CM | POA: Insufficient documentation

## 2017-04-05 MED ORDER — OXYCODONE-ACETAMINOPHEN 5-325 MG PO TABS
ORAL_TABLET | ORAL | Status: AC
  Filled 2017-04-05: qty 1

## 2017-04-05 MED ORDER — OXYCODONE-ACETAMINOPHEN 5-325 MG PO TABS
1.0000 | ORAL_TABLET | ORAL | Status: DC | PRN
Start: 2017-04-05 — End: 2017-04-05
  Administered 2017-04-05: 1 via ORAL

## 2017-04-05 MED ORDER — CYCLOBENZAPRINE HCL 10 MG PO TABS: 10.0000 mg | ORAL_TABLET | Freq: Two times a day (BID) | ORAL | 0 refills | Status: DC | PRN

## 2017-04-05 NOTE — Discharge Instructions (Signed)
Please schedule an appointment with [facility] across the street to establish care and for blood pressure recheck. Your blood pressure was elevated in the ER today.   I have written you a note for work and a prescription for a muscle relaxer. Please do not drive or consume alcohol while taking this medication. It can make you drowsy.   Back Pain:  Your back pain should be treated with medicines such as ibuprofen or aleve and this back pain should get better over the next 2 weeks.  However if you develop severe or worsening pain, low back pain with fever, numbness, weakness or inability to walk or urinate, you should return to the ER immediately.  Please follow up with your doctor this week for a recheck if still having symptoms.  Low back pain is discomfort in the lower back that may be due to injuries to muscles and ligaments around the spine.  Occasionally, it may be caused by a a problem to a part of the spine called a disc.  The pain may last several days or a week;  However, most patients get completely well in 4 weeks.  Self - care:  The application of heat can help soothe the pain.  Maintaining your daily activities, including walking, is encourged, as it will help you get better faster than just staying in bed.  Medications are also useful to help with pain control.  A commonly prescribed medications includes acetaminophen.  This medication is generally safe, though you should not take more than 8 of the extra strength (500mg ) pills a day.  Non steroidal anti inflammatory medications including Ibuprofen and naproxen;  These medications help both pain and swelling and are very useful in treating back pain.  They should be taken with food, as they can cause stomach upset, and more seriously, stomach bleeding.    Muscle relaxants:  These medications can help with muscle tightness that is a cause of lower back pain.  Most of these medications can cause drowsiness, and it is not safe to drive  or use dangerous machinery while taking them.  You will need to follow up with  Your primary healthcare provider in 1-2 weeks for reassessment.

## 2017-04-05 NOTE — ED Provider Notes (Signed)
[facility] DEPT Provider Note   CSN: [id number] Arrival date & time: 04/05/17  5188     History   Chief Complaint Chief Complaint  Patient presents with  . Back Pain    HPI Sharon Walton is a 46 y.o. female.  HPI   [name] is a 46yo female with a history of bulging L5 and chronic right sided low back pain, GERD, HTN who presents to the Emergency Department for evaluation of worsening of her right back pain this morning. Patient states that she has had right sided back pain radiating down her but and posterior thigh for about a year now. She is followed by a neurologist. Recently started PT and states that she thinks it is making the pain worse. She states that her pain is usually worse in the morning, but improves when she sits down to get in the car for work, but this morning the pain did not improve when she got to work. States her pain is 5/10 in severity, "sharp" in nature and constant. It is worsened with standing or sitting for prolonged periods without changing position. She has taken ibuprofen for the pain. Has taken gabapentin in the past, but states that it does not help. She denies numbness, weakness, saddle anesthesia, fever, urinary difficulty, bowel incontinence, abdominal pain, dysuria, hematuria, N/V. She is asking for a work note. No history of cancer, denies IV drug use.   Past Medical History:  Diagnosis Date  . Attention deficit disorder (ADD)   . Hypertension   . Panic attacks     There are no active problems to display for this patient.   Past Surgical History:  Procedure Laterality Date  . ABDOMINAL HYSTERECTOMY    . CHOLECYSTECTOMY      OB History    No data available       Home Medications    Prior to Admission medications   Medication Sig Start Date End Date Taking? Authorizing Provider  Acetaminophen-Aspirin Buffered (EXCEDRIN BACK & BODY) 250-250 MG tablet Take two tablets by mouth every 6 hours as needed for pain 12/06/11   Hunt,  Bethany, PA-C  ALPRAZolam Duanne Moron) 1 MG tablet Take 1 mg by mouth 3 (three) times daily as needed. For Stomach nerves/anxiety    [provider]  cyclobenzaprine (FLEXERIL) 10 MG tablet Take 1 tablet (10 mg total) by mouth 2 (two) times daily as needed for muscle spasms. 04/05/17   Glyn Ade, PA-C  gabapentin (NEURONTIN) 100 MG capsule Take 100 mg by mouth 3 (three) times daily. Ran out of this medicine for a week, reports picked up medicine refill yesterday and started back on medicine    [provider]  lisdexamfetamine (VYVANSE) 30 MG capsule Take 60 mg by mouth See admin instructions. Just on days she goes to school    [provider]  lisinopril (PRINIVIL,ZESTRIL) 20 MG tablet Take 20 mg by mouth at bedtime.     [provider]  mirtazapine (REMERON) 30 MG tablet Take 30 mg by mouth at bedtime.    [provider]  naproxen sodium (ANAPROX) 220 MG tablet Take 660 mg by mouth 3 (three) times daily as needed. For pain    [provider]    Family History History reviewed. No pertinent family history.  Social History Social History  Substance Use Topics  . Smoking status: Current Every Day Smoker  . Smokeless tobacco: Never Used  . Alcohol use No     Allergies  Patient has no known allergies.   Review of Systems Review of Systems  Constitutional: Negative for chills, fatigue and fever.  Respiratory: Negative for shortness of breath.   Cardiovascular: Negative for chest pain.  Gastrointestinal: Negative for abdominal pain, diarrhea, nausea and vomiting.  Genitourinary: Negative for difficulty urinating.  Musculoskeletal: Positive for back pain. Negative for gait problem, neck pain and neck stiffness.  Skin: Negative for rash and wound.  Neurological: Negative for dizziness, weakness, light-headedness, numbness and headaches.  Psychiatric/Behavioral: Negative for agitation.  All other systems reviewed and are  negative.    Physical Exam Updated Vital Signs BP (!) 132/50   Pulse 69   Temp 98 F (36.7 C) (Oral)   Resp 16   SpO2 98%   Physical Exam  Constitutional: She is oriented to person, place, and time. She appears well-developed and well-nourished. No distress.  Patient calm, sitting comfortably in the bed  HENT:  Head: Normocephalic and atraumatic.  Mouth/Throat: Oropharynx is clear and moist. No oropharyngeal exudate.  Eyes: Pupils are equal, round, and reactive to light. Right eye exhibits no discharge. Left eye exhibits no discharge.  Neck: Normal range of motion. Neck supple.  Cardiovascular: Normal rate and regular rhythm.  Exam reveals no friction rub.   No murmur heard. Pulmonary/Chest: Effort normal and breath sounds normal. No respiratory distress. She has no wheezes. She has no rales.  Abdominal: Soft. Bowel sounds are normal. She exhibits no mass. There is no tenderness.  No CVA tenderness  Musculoskeletal: Normal range of motion.  Tenderness to palpation over right lower back. No TTP over spinous processes of thoracic or lumbar spine.   Neurological: She is alert and oriented to person, place, and time.  Normal motor tone. 5/5 strength in LE Sensory: Pinprick and light touch normal in bilateral LE  2+ patellar reflexes bilaterally Gait: normal gait and balance DP and PT pulses 2+ bilaterally   Skin: Skin is warm and dry. Capillary refill takes less than 2 seconds.  Psychiatric: She has a normal mood and affect. Her behavior is normal.  Nursing note and vitals reviewed.    ED Treatments / Results  Labs (all labs ordered are listed, but only abnormal results are displayed) Labs Reviewed - No data to display  EKG  EKG Interpretation None       Radiology No results found.  Procedures Procedures (including critical care time)  Medications Ordered in ED Medications - No data to display   Initial Impression / Assessment and Plan / ED Course  I have  reviewed the triage vital signs and the nursing notes.  Pertinent labs & imaging results that were available during my care of the patient were reviewed by me and considered in my medical decision making (see chart for details).     Patient with back pain. No neurological deficits and normal neuro exam.  Patient can walk but states is painful. No loss of bowel or bladder control. No concern for cauda equina.  No fever, night sweats, weight loss, h/o cancer, IVDU. Symptomatic treatment with NSAIDs, heat and muscle relaxer indicated and discussed with patient. Discussed return precautions including loss bowel incontinence, urinary retention, bilateral numbness, fever with back pain and patient agrees and voices understanding. Patient given information to establish care at [facility] for further management of her back pain symptoms and also for blood pressure recheck as her bp was mildly elevated in the ER today. Discussed this patient with Dr. Thomasene Lot who agrees with discharge.  Final Clinical Impressions(s) / ED Diagnoses   Final diagnoses:  Chronic right-sided low back pain with right-sided sciatica    New Prescriptions Discharge Medication List as of 04/05/2017 10:22 AM    START taking these medications   Details  cyclobenzaprine (FLEXERIL) 10 MG tablet Take 1 tablet (10 mg total) by mouth 2 (two) times daily as needed for muscle spasms., Starting Mon 04/05/2017, Print         Glyn Ade, PA-C 04/05/17 2053    Macarthur Critchley, MD 04/10/17 1536

## 2017-04-05 NOTE — ED Triage Notes (Signed)
Pt to ER with complaint of lower back pain radiating down right leg onset this morning when getting out of bed. States hx of same but usually can "shake it" once she gets up, states it did not resolve this morning. Reports bulging disc, takes gabapentin and ibuprofen and is currently attending PT. Ambulatory, denies bowel/bladder dysfunction.

## 2017-04-08 ENCOUNTER — Other Ambulatory Visit: Payer: Self-pay | Admitting: Physical Medicine and Rehabilitation

## 2017-04-08 DIAGNOSIS — M5416 Radiculopathy, lumbar region: Secondary | ICD-10-CM

## 2017-04-23 ENCOUNTER — Inpatient Hospital Stay
Admission: RE | Admit: 2017-04-23 | Discharge: 2017-04-23 | Disposition: A | Discharge status: Home / Self Care | Payer: No Typology Code available for payment source | Source: Ambulatory Visit | Attending: Physical Medicine and Rehabilitation | Admitting: Physical Medicine and Rehabilitation

## 2017-05-21 ENCOUNTER — Ambulatory Visit
Admission: RE | Admit: 2017-05-21 | Discharge: 2017-05-21 | Disposition: A | Discharge status: Home / Self Care | Payer: No Typology Code available for payment source | Source: Ambulatory Visit | Attending: Physical Medicine and Rehabilitation | Admitting: Physical Medicine and Rehabilitation

## 2017-05-21 DIAGNOSIS — M5416 Radiculopathy, lumbar region: Secondary | ICD-10-CM

## 2017-05-21 MED ORDER — METHYLPREDNISOLONE ACETATE 40 MG/ML INJ SUSP (RADIOLOG
120.0000 mg | Freq: Once | INTRAMUSCULAR | Status: AC
  Administered 2017-05-21: 120 mg via EPIDURAL

## 2017-05-21 MED ORDER — IOPAMIDOL (ISOVUE-M 200) INJECTION 41%
1.0000 mL | Freq: Once | INTRAMUSCULAR | Status: AC
  Administered 2017-05-21: 1 mL via EPIDURAL

## 2017-05-21 NOTE — Discharge Instructions (Signed)

## 2019-09-08 ENCOUNTER — Emergency Department: Payer: Self-pay

## 2019-09-08 ENCOUNTER — Other Ambulatory Visit: Payer: Self-pay

## 2019-09-08 ENCOUNTER — Encounter: Payer: Self-pay | Admitting: Emergency Medicine

## 2019-09-08 ENCOUNTER — Emergency Department
Admission: EM | Admit: 2019-09-08 | Discharge: 2019-09-08 | Disposition: A | Discharge status: Home / Self Care | Payer: Self-pay | Attending: Emergency Medicine | Admitting: Emergency Medicine

## 2019-09-08 DIAGNOSIS — F17200 Nicotine dependence, unspecified, uncomplicated: Secondary | ICD-10-CM | POA: Diagnosis not present

## 2019-09-08 DIAGNOSIS — Y92410 Unspecified street and highway as the place of occurrence of the external cause: Secondary | ICD-10-CM | POA: Diagnosis not present

## 2019-09-08 DIAGNOSIS — I10 Essential (primary) hypertension: Secondary | ICD-10-CM | POA: Diagnosis not present

## 2019-09-08 DIAGNOSIS — Y999 Unspecified external cause status: Secondary | ICD-10-CM | POA: Diagnosis not present

## 2019-09-08 DIAGNOSIS — M25531 Pain in right wrist: Secondary | ICD-10-CM | POA: Diagnosis not present

## 2019-09-08 DIAGNOSIS — Y9389 Activity, other specified: Secondary | ICD-10-CM | POA: Insufficient documentation

## 2019-09-08 DIAGNOSIS — M545 Low back pain, unspecified: Secondary | ICD-10-CM

## 2019-09-08 DIAGNOSIS — Z79899 Other long term (current) drug therapy: Secondary | ICD-10-CM | POA: Insufficient documentation

## 2019-09-08 DIAGNOSIS — F419 Anxiety disorder, unspecified: Secondary | ICD-10-CM | POA: Insufficient documentation

## 2019-09-08 MED ORDER — CYCLOBENZAPRINE HCL 10 MG PO TABS
10.0000 mg | ORAL_TABLET | Freq: Once | ORAL | Status: AC
  Administered 2019-09-08: 10 mg via ORAL
  Filled 2019-09-08: qty 1

## 2019-09-08 MED ORDER — HYDROXYZINE HCL 25 MG PO TABS
25.0000 mg | ORAL_TABLET | Freq: Once | ORAL | Status: AC
  Administered 2019-09-08: 05:00:00 25 mg via ORAL
  Filled 2019-09-08: qty 1

## 2019-09-08 MED ORDER — LIDOCAINE 5 % EX PTCH
1.0000 | MEDICATED_PATCH | CUTANEOUS | Status: DC
  Administered 2019-09-08: 1 via TRANSDERMAL
  Filled 2019-09-08: qty 1
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
## 2019-09-08 NOTE — ED Notes (Signed)
Officer Justice interviewed patient after triage
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
## 2019-11-13 ENCOUNTER — Emergency Department: Payer: Self-pay

## 2019-11-13 ENCOUNTER — Other Ambulatory Visit: Payer: Self-pay

## 2019-11-13 ENCOUNTER — Emergency Department
Admission: EM | Admit: 2019-11-13 | Discharge: 2019-11-15 | Disposition: A | Discharge status: Home / Self Care | Payer: Self-pay | Attending: Emergency Medicine | Admitting: Emergency Medicine

## 2019-11-13 DIAGNOSIS — F17200 Nicotine dependence, unspecified, uncomplicated: Secondary | ICD-10-CM | POA: Insufficient documentation

## 2019-11-13 DIAGNOSIS — F32A Depression, unspecified: Secondary | ICD-10-CM

## 2019-11-13 DIAGNOSIS — F329 Major depressive disorder, single episode, unspecified: Secondary | ICD-10-CM

## 2019-11-13 DIAGNOSIS — Z20822 Contact with and (suspected) exposure to covid-19: Secondary | ICD-10-CM | POA: Insufficient documentation

## 2019-11-13 DIAGNOSIS — Z79899 Other long term (current) drug therapy: Secondary | ICD-10-CM | POA: Insufficient documentation

## 2019-11-13 DIAGNOSIS — I10 Essential (primary) hypertension: Secondary | ICD-10-CM | POA: Insufficient documentation

## 2019-11-13 DIAGNOSIS — F331 Major depressive disorder, recurrent, moderate: Secondary | ICD-10-CM | POA: Insufficient documentation

## 2019-11-13 DIAGNOSIS — R45851 Suicidal ideations: Secondary | ICD-10-CM | POA: Insufficient documentation

## 2019-11-13 DIAGNOSIS — F419 Anxiety disorder, unspecified: Secondary | ICD-10-CM | POA: Insufficient documentation

## 2019-11-13 LAB — COMPREHENSIVE METABOLIC PANEL
ALT: 14 U/L (ref 0–44)
AST: 20 U/L (ref 15–41)
Albumin: 4.1 g/dL (ref 3.5–5.0)
Alkaline Phosphatase: 98 U/L (ref 38–126)
Anion gap: 8 (ref 5–15)
BUN: 9 mg/dL (ref 6–20)
CO2: 25 mmol/L (ref 22–32)
Calcium: 8.9 mg/dL (ref 8.9–10.3)
Chloride: 106 mmol/L (ref 98–111)
Creatinine, Ser: 0.78 mg/dL (ref 0.44–1.00)
GFR calc Af Amer: >60 mL/min (ref >60)
GFR calc non Af Amer: >60 mL/min (ref >60)
Glucose, Bld: 103 mg/dL — ABNORMAL HIGH (ref 70–99)
Potassium: 3.2 mmol/L — ABNORMAL LOW (ref 3.5–5.1)
Sodium: 139 mmol/L (ref 135–145)
Total Bilirubin: 0.4 mg/dL (ref 0.3–1.2)
Total Protein: 7.8 g/dL (ref 6.5–8.1)

## 2019-11-13 LAB — URINE DRUG SCREEN, QUALITATIVE (ARMC ONLY)
Amphetamines, Ur Screen: POSITIVE — AB
Barbiturates, Ur Screen: NOT DETECTED
Benzodiazepine, Ur Scrn: POSITIVE — AB
Cannabinoid 50 Ng, Ur ~~LOC~~: NOT DETECTED
Cocaine Metabolite,Ur ~~LOC~~: POSITIVE — AB
MDMA (Ecstasy)Ur Screen: NOT DETECTED
Methadone Scn, Ur: NOT DETECTED
Opiate, Ur Screen: NOT DETECTED
Phencyclidine (PCP) Ur S: NOT DETECTED
Tricyclic, Ur Screen: NOT DETECTED

## 2019-11-13 LAB — ETHANOL: Alcohol, Ethyl (B): <10 mg/dL (ref <10)

## 2019-11-13 LAB — CBC
HCT: 36 % (ref 36.0–46.0)
Hemoglobin: 11.9 g/dL — ABNORMAL LOW (ref 12.0–15.0)
MCH: 26.7 pg (ref 26.0–34.0)
MCHC: 33.1 g/dL (ref 30.0–36.0)
MCV: 80.9 fL (ref 80.0–100.0)
Platelets: 336 10*3/uL (ref 150–400)
RBC: 4.45 MIL/uL (ref 3.87–5.11)
RDW: 13.2 % (ref 11.5–15.5)
WBC: 10.7 10*3/uL — ABNORMAL HIGH (ref 4.0–10.5)
nRBC: 0 % (ref 0.0–0.2)

## 2019-11-13 LAB — SALICYLATE LEVEL: Salicylate Lvl: <7 mg/dL — ABNORMAL LOW (ref 7.0–30.0)

## 2019-11-13 LAB — SARS CORONAVIRUS 2 BY RT PCR (HOSPITAL ORDER, PERFORMED IN ~~LOC~~ HOSPITAL LAB): SARS Coronavirus 2: NEGATIVE

## 2019-11-13 LAB — ACETAMINOPHEN LEVEL: Acetaminophen (Tylenol), Serum: <10 ug/mL — ABNORMAL LOW (ref 10–30)

## 2019-11-13 MED ORDER — ACETAMINOPHEN 500 MG PO TABS
1000.0000 mg | ORAL_TABLET | Freq: Three times a day (TID) | ORAL | Status: DC | PRN
  Administered 2019-11-13 – 2019-11-14 (×3): 1000 mg via ORAL
  Filled 2019-11-13 (×3): qty 2

## 2019-11-13 MED ORDER — ACETAMINOPHEN 500 MG PO TABS
1000.0000 mg | ORAL_TABLET | Freq: Once | ORAL | Status: AC
  Administered 2019-11-13: 1000 mg via ORAL
  Filled 2019-11-13: qty 2

## 2019-11-13 MED ORDER — LORAZEPAM 1 MG PO TABS
1.0000 mg | ORAL_TABLET | Freq: Once | ORAL | Status: AC
  Administered 2019-11-13: 1 mg via ORAL
  Filled 2019-11-13: qty 1
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
## 2019-11-13 NOTE — ED Notes (Signed)
Pt. Alert and oriented, warm and dry, in no distress. Pt. Denies SI, HI, and AVH. Pt. Encouraged to let nursing staff know of any concerns or needs. 
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
## 2019-11-13 NOTE — ED Notes (Signed)
Pt to Xray.

## 2019-11-13 NOTE — ED Notes (Signed)
Meal tray provided.

## 2019-11-13 NOTE — ED Notes (Signed)
Pt. Transferred from Triage to room after dressing out and screening for contraband. Pt. Oriented to Quad including Q15 minute rounds as well as Engineer, drilling for their protection. Patient is alert and oriented, warm and dry in no acute distress. Patient denies SI, HI, and AVH. Pt. Encouraged to let me know if needs arise.

## 2019-11-13 NOTE — ED Notes (Signed)
Pt given phone and number for [facility].

## 2019-11-13 NOTE — ED Notes (Signed)
Pt complaining of severe back pain radiating down to her feet. Pt states she has a bad back, but it normally does not hurt this badly.   RN will notify EDP.

## 2019-11-13 NOTE — ED Notes (Signed)

## 2019-11-13 NOTE — ED Notes (Signed)
VOL/ Consult completed/ Moved to [place]
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
## 2019-11-13 NOTE — ED Notes (Signed)
Meal tray given 

## 2019-11-13 NOTE — ED Notes (Signed)
Pt has taken a shower.  
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
## 2019-11-13 NOTE — ED Notes (Signed)
Pt moved to [place]. Report given to Earlie Server, Therapist, sports.

## 2019-11-14 MED ORDER — LIDOCAINE 5 % EX PTCH
1.0000 | MEDICATED_PATCH | CUTANEOUS | Status: DC
  Administered 2019-11-14: 1 via TRANSDERMAL
  Filled 2019-11-14 (×2): qty 1

## 2019-11-14 MED ORDER — CLONAZEPAM 0.5 MG PO TABS
0.5000 mg | ORAL_TABLET | Freq: Two times a day (BID) | ORAL | Status: DC
  Administered 2019-11-14 – 2019-11-15 (×3): 0.5 mg via ORAL
  Filled 2019-11-14 (×3): qty 1

## 2019-11-14 NOTE — ED Notes (Signed)
Pt. Alert and oriented, warm and dry, in no distress. Pt. Denies SI, HI, and AVH. Pt. Encouraged to let nursing staff know of any concerns or needs. 

## 2019-11-14 NOTE — ED Notes (Signed)
VOL/Pending Dispo.

## 2019-11-14 NOTE — ED Notes (Signed)

## 2019-11-14 NOTE — ED Provider Notes (Signed)
Emergency Medicine Observation Re-evaluation Note  Sharon Walton is a 49 y.o. female, seen on rounds today.  Pt initially presented to the ED for complaints of Psychiatric Evaluation Currently, the patient is resting in no acute distress.  Physical Exam  BP (!) 110/56 (BP Location: Left Arm)   Pulse 78   Temp 98.4 F (36.9 C) (Oral)   Resp 20   Ht 5\' 4"  (1.626 m)   Wt 86.2 kg   SpO2 99%   BMI 32.61 kg/m  Physical Exam  ED Course / MDM  EKG:  Clinical Course as of Nov 13 708  Mon Nov 13, 2019  [id number] The patient has been placed in psychiatric observation due to the need to provide a safe environment for the patient while obtaining psychiatric consultation and evaluation, as well as ongoing medical and medication management to treat the patient's condition. The patient has not been placed under full IVC at this time.     [JS]    Clinical Course User Index [JS] Paulette Blanch, MD   I have reviewed the labs performed to date as well as medications administered while in observation.  Recent changes in the last 24 hours include no events overnight. Plan  Current plan is for [facility] placement assistance. Patient is not under full IVC at this time.   Paulette Blanch, MD 11/14/19 [phone]

## 2019-11-14 NOTE — Consult Note (Signed)
I discussed the patient's care with nursing.  As noted from my earlier evaluation one  obstacle to outpatient placement appears to be a potential panic disorder related to placement in crowded areas.  Thus we have not seen such problems here in the hospital.  On that assumption I thus started clonazepam 0.5 mg twice a day to see if this can address potential anxiety and help her to succeed as an outpatient in an environment where she feels safe and which is supportive of her concerns.

## 2019-11-15 MED ORDER — CLONAZEPAM 0.5 MG PO TABS
0.5000 mg | ORAL_TABLET | Freq: Two times a day (BID) | ORAL | 0 refills | Status: DC
Start: 2019-11-15 — End: 2020-01-25

## 2019-11-15 MED ORDER — LIDOCAINE 5 % EX PTCH: 1.0000 | MEDICATED_PATCH | Freq: Two times a day (BID) | CUTANEOUS | 0 refills | Status: DC

## 2019-11-15 NOTE — TOC Initial Note (Addendum)
Transition of Care [facility]) - Initial/Assessment Note    Patient Details  Name: MATISEN SARVER MRN: [id number] Date of Birth: 1971/06/07  Transition of Care [facility]) CM/SW Contact:    Anselm Pancoast, RN Phone Number: 11/15/2019, 12:03 PM  Clinical Narrative:                 Spoke with patient at bedside who states she plans to return to [facility]. TOC RN CM reviewed [facility] application and medication management process. Patient will need transportation to [facility] and will receive medications from med mgmt prior to discharge.   Left VM with [facility] to confirm bed availability for patient.   Expected Discharge Plan: Homeless Shelter Barriers to Discharge: Inadequate or no insurance   Patient Goals and CMS Choice Patient states their goals for this hospitalization and ongoing recovery are:: get medication assistance      Expected Discharge Plan and Services Expected Discharge Plan: [place]       Living arrangements for the past 2 months: No permanent address(living in an abandoned trailer)                                      Prior Living Arrangements/Services Living arrangements for the past 2 months: No permanent address(living in an abandoned trailer) Lives with:: Friends Patient language and need for interpreter reviewed:: Yes Do you feel safe going back to the place where you live?: No   planning to return to [facility]  Need for Family Participation in Patient Care: No (Comment) Care giver support system in place?: No (comment)   Criminal Activity/Legal Involvement Pertinent to Current Situation/Hospitalization: No - Comment as needed  Activities of Daily Living      Permission Sought/Granted   Permission granted to share information with : Yes, Verbal Permission Granted  Share Information with NAME: TOC Department           Emotional Assessment Appearance:: Appears stated age,  Disheveled Attitude/Demeanor/Rapport: Engaged Affect (typically observed): Accepting, Calm Orientation: : Oriented to Self, Oriented to Place, Oriented to  Time, Oriented to Situation Alcohol / Substance Use: Tobacco Use, Alcohol Use, Illicit Drugs Psych Involvement: Yes (comment)  Admission diagnosis:  just needs help not safe at home. Patient Active Problem List   Diagnosis Date Noted  . Depressive disorder 11/13/2019   PCP:  Patient, No Pcp Per Pharmacy:   [facility], [address] [place] [address] Phone: [phone] Fax: [facility] [phone] [address] [place], [place] [address] Phone: [phone] Fax: [phone]     Social Determinants of Health (SDOH) Interventions    Readmission Risk Interventions No flowsheet data found.

## 2019-11-15 NOTE — ED Provider Notes (Signed)
Emergency Medicine Observation Re-evaluation Note  Sharon Walton is a 49 y.o. female, seen on rounds today.  Pt initially presented to the ED for complaints of Psychiatric Evaluation Currently, the patient is stable.  Physical Exam  BP (!) 144/59 (BP Location: Left Arm)   Pulse 70   Temp 98.8 F (37.1 C) (Oral)   Resp 18   Ht 5\' 4"  (1.626 m)   Wt 86.2 kg   SpO2 98%   BMI 32.61 kg/m  Physical Exam  ED Course / MDM  EKG:  Clinical Course as of Nov 14 1013  Mon Nov 13, 2019  0654 The patient has been placed in psychiatric observation due to the need to provide a safe environment for the patient while obtaining psychiatric consultation and evaluation, as well as ongoing medical and medication management to treat the patient's condition. The patient has not been placed under full IVC at this time.     [JS]    Clinical Course User Index [JS] Paulette Blanch, MD   I have reviewed the labs performed to date as well as medications administered while in observation.  Recent changes in the last 24 hours include none. Plan  Current plan is for psych dispo. Patient is not under full IVC at this time.   Duffy Bruce, MD 11/15/19 1015

## 2019-11-15 NOTE — ED Provider Notes (Signed)
Patient cleared by psychiatry. She has been stable and is awake, alert, with safe disposition plan per TTS and social work. Discussed with Dr. Satira Sark, who has requested klonopin 0.5 BID x 3 days which is reasonable. She will have a ride arranged to shelter. Return precautions given.   Duffy Bruce, MD 11/15/19 1447

## 2019-11-15 NOTE — ED Notes (Signed)
Pt ready for discharge. Discharge teaching and aftercare follow-up discussed with pt. Prescription given to pt. Pt escorted to lobby by the SW, ambulating with steady gait and in NAD.

## 2019-11-15 NOTE — TOC Transition Note (Addendum)
Transition of Care [facility]) - CM/SW Discharge Note   Patient Details  Name: Sharon Walton MRN: [id number] Date of Birth: 08-Jun-1971  Transition of Care [facility]) CM/SW Contact:  Anselm Pancoast, RN Phone Number: 11/15/2019, 1:24 PM   Clinical Narrative:    Confirmed bed availability at [facility] and confirmed transportation via [place] for 2pm. Prescriptions sent to medication management.   Writer notified by med management they are unable to fill prescriptions for controlled substances. EDP and psychiatrist notified and will send hard copy with patient if needed.      Barriers to Discharge: Inadequate or no insurance   Patient Goals and CMS Choice Patient states their goals for this hospitalization and ongoing recovery are:: get medication assistance      Discharge Placement                       Discharge Plan and Services                                     Social Determinants of Health (SDOH) Interventions     Readmission Risk Interventions No flowsheet data found.

## 2019-11-15 NOTE — ED Notes (Signed)
VOL/PENDING DISPO

## 2019-11-15 NOTE — Consult Note (Signed)
I met with the patient this morning and reviewed her progress with nursing as well as reviewing the notes.  She remains calm and without significant symptoms here in the emergency department.  There specifically is no lethality and she is aware of and comfortable with the discharge plan arranged by the nursing case manager.  In this stay we initiated a brief trial of clonazepam 0.5 twice a day.  I explained to her that this would be a far better choice if she has the kind of panic symptoms that she had in the past when she was around groups of people.  She noted that in the past she had used alprazolam for anxiety.  It is unclear if these were prescribed or not.  Her initial urine had shown benzodiazepines so she has some recent use of them.  She currently has no anxiety symptoms and is calm here in the hospital.  I do not have sufficient data to diagnose panic disorder with agoraphobia though this is certainly a possibility.  Unfortunately at the same time she has been taking stimulant medications both cocaine and amphetamines per her urine drug screen so 1 would need to clarify that these symptoms occurred outside of substance use.  She does not currently have a need for clonazepam and it can be discontinued.  However if future evaluation clarifies that she does indeed have panic disorder with agoraphobia this would be a far superior choice to shorter acting benzodiazepines such as alprazolam or lorazepam.    I appreciate the efforts to find a adequate discharge strategy for the patient as well as to assist with arranging transportation.  The patient agrees with this plan and she will be discharged today.

## 2019-11-15 NOTE — ED Notes (Signed)
Pt denies SI/Hi/AVH at time of assessment. Awaiting SW consult.

## 2019-11-15 NOTE — ED Notes (Signed)
Pt signed paper discharge form.

## 2020-01-25 ENCOUNTER — Encounter: Payer: Self-pay | Admitting: Gerontology

## 2020-01-25 ENCOUNTER — Other Ambulatory Visit: Payer: Self-pay

## 2020-01-25 ENCOUNTER — Ambulatory Visit: Payer: Medicaid Other | Admitting: Gerontology

## 2020-01-25 VITALS — BP 118/79 | HR 70 | Ht 64.0 in | Wt 201.0 lb

## 2020-01-25 DIAGNOSIS — M545 Low back pain, unspecified: Secondary | ICD-10-CM

## 2020-01-25 DIAGNOSIS — G8929 Other chronic pain: Secondary | ICD-10-CM

## 2020-01-25 DIAGNOSIS — Z7689 Persons encountering health services in other specified circumstances: Secondary | ICD-10-CM | POA: Insufficient documentation

## 2020-01-25 MED ORDER — GABAPENTIN 100 MG PO CAPS: 100.0000 mg | ORAL_CAPSULE | Freq: Three times a day (TID) | ORAL | 0 refills | Status: DC

## 2020-01-25 NOTE — Progress Notes (Signed)
Patient ID: Sharon Walton, female   DOB: 08-16-1970, 49 y.o.   MRN: [id number]  Chief Complaint  Patient presents with  . Establish Care  . Back Pain    MRI done in 2018  . Knee Pain    knot in R knee that is moveable    HPI Sharon Walton is a 49 y.o. female who presents to establish care and evaluation of her chronic conditions. She resides at [facility] for rehabilitation. She reports having a cyst to her lower spine and a cyst on her right knee that readily moves with a slight finger pressure. She states that her back pain is chronic, that standing for more than one hour exacerbate pain, but resting and taking gabapentin moderately relieves symptoms.She states that the cyst to her knee has been there for 6 months. She reports experiencing burning sensation to her right knee that radiates to her lower leg and it is worst when she stands. She states that taking Ibuprofen and Tylenol mildly relieves pain. She states that she took gabapentin 300 mg tid 5 years ago that moderately relieved her lower back pain. She reports that she smokes less than 1 pack of cigarette daily and admits the desire to quit. She was discharged from the hospital on 11/15/2019 for Psychiatric evaluation. She states that her mood is good, denies suicidal nor homicidal ideation. Overall, she states that she's doing well and offers no further complaint.   Past Medical History:  Diagnosis Date  . Attention deficit disorder (ADD)   . Hypertension   . Panic attacks     Past Surgical History:  Procedure Laterality Date  . ABDOMINAL HYSTERECTOMY    . CHOLECYSTECTOMY      History reviewed. No pertinent family history.  Social History Social History   Tobacco Use  . Smoking status: Current Every Day Smoker    Types: Cigarettes  . Smokeless tobacco: Never Used  . Tobacco comment: down to 3 cigs a day 01/25/20  Vaping Use  . Vaping Use: Never used  Substance Use Topics  . Alcohol use: No  . Drug use: No     Allergies  Allergen Reactions  . Morphine Rash    Current Outpatient Medications  Medication Sig Dispense Refill  . gabapentin (NEURONTIN) 100 MG capsule Take 1 capsule (100 mg total) by mouth 3 (three) times daily. 90 capsule 0   No current facility-administered medications for this visit.    Review of Systems Review of Systems  Constitutional: Negative.   HENT: Negative.   Eyes: Negative.   Respiratory: Negative.   Cardiovascular: Negative.   Gastrointestinal: Negative.   Endocrine: Negative.   Genitourinary: Negative.   Musculoskeletal: Positive for back pain (chronic back pain).  Skin: Negative.   Neurological: Negative.   Hematological: Negative.   Psychiatric/Behavioral: Negative.     Blood pressure 118/79, pulse 70, height 5\' 4"  (1.626 m), weight 201 lb (91.2 kg), SpO2 98 %.  Physical Exam Physical Exam Constitutional:      Appearance: Normal appearance.  HENT:     Head: Normocephalic and atraumatic.     Nose: Nose normal.     Mouth/Throat:     Mouth: Mucous membranes are moist.  Eyes:     Extraocular Movements: Extraocular movements intact.     Pupils: Pupils are equal, round, and reactive to light.  Cardiovascular:     Rate and Rhythm: Normal rate and regular rhythm.     Pulses: Normal pulses.     Heart sounds:  Normal heart sounds.  Pulmonary:     Effort: Pulmonary effort is normal.     Breath sounds: Normal breath sounds.  Abdominal:     General: Bowel sounds are normal.     Palpations: Abdomen is soft.  Genitourinary:    Comments: Deferred per patient. Musculoskeletal:        General: Tenderness (to right knee with palpation) present.     Cervical back: Normal range of motion.  Skin:    General: Skin is warm and dry.  Neurological:     General: No focal deficit present.     Mental Status: She is alert and oriented to person, place, and time. Mental status is at baseline.  Psychiatric:        Mood and Affect: Mood normal.         Behavior: Behavior normal.        Thought Content: Thought content normal.        Judgment: Judgment normal.     Data Reviewed Lab and past medical history was reviewed.  Assessment and Plan  1. Encounter to establish care -Routine labs will be checked. - HgB A1c; Future - Lipid panel; Future - Urinalysis; Future - Ambulatory referral to Hematology / Oncology for Pap smear  2. Chronic low back pain, unspecified back pain laterality, unspecified whether sciatica present - She will continue on gabapentin, educated on medication side effects and advised to notify clinic. - gabapentin (NEURONTIN) 100 MG capsule; Take 1 capsule (100 mg total) by mouth 3 (three) times daily.  Dispense: 90 capsule; Refill: 0 - She will follow up with [facility] Orthopedic Surgeon Dr Vickki Hearing.  3. Chronic pain of right knee - She will continue on gabapentin, educated on medication side effects and advised to notify clinic. - gabapentin (NEURONTIN) 100 MG capsule; Take 1 capsule (100 mg total) by mouth 3 (three) times daily.  Dispense: 90 capsule; Refill: 0  -She will follow up with [facility] Orthopedic Surgeon Dr Vickki Hearing.    Follow up: 02/14/2020 if symptoms worsen or fail to improve.   Shadee Rathod E Jonisha Kindig 01/25/2020, 10:19 PM

## 2020-01-25 NOTE — Patient Instructions (Signed)

## 2020-02-05 ENCOUNTER — Other Ambulatory Visit: Payer: Self-pay

## 2020-02-05 ENCOUNTER — Ambulatory Visit: Payer: Medicaid Other | Admitting: Pharmacy Technician

## 2020-02-05 DIAGNOSIS — Z79899 Other long term (current) drug therapy: Secondary | ICD-10-CM

## 2020-02-05 NOTE — Progress Notes (Signed)
Completed financial assistance application for [facility] due to recent hospital visit. Patient agreed to be responsible for gathering financial information and forwarding to appropriate department in [facility].    Completed [facility] application and contract.  Patient verbally agreed to all terms of the [facility] contract.  Faxed contract to patient to sign and initial each bulleted point.      Patient approved to receive medication assistance at [facility], until time for re-certification in 1292, and as long as eligibility criteria continues to be met.    Provided patient with community resource material based on her particular needs.    Referred patient for MTM.  [facility]

## 2020-02-07 ENCOUNTER — Other Ambulatory Visit: Payer: Medicaid Other

## 2020-02-14 ENCOUNTER — Ambulatory Visit: Payer: Medicaid Other | Admitting: Gerontology

## 2020-03-05 ENCOUNTER — Ambulatory Visit: Payer: Medicaid Other | Admitting: Specialist

## 2020-03-13 ENCOUNTER — Ambulatory Visit: Payer: Medicaid Other

## 2020-03-13 ENCOUNTER — Other Ambulatory Visit: Payer: Self-pay

## 2020-03-13 DIAGNOSIS — Z79899 Other long term (current) drug therapy: Secondary | ICD-10-CM

## 2020-03-13 NOTE — Progress Notes (Addendum)
  [facility] Visit Note  Patient: Sharon Walton MRN: [id number] Date of Birth: 1970/08/02 PCP: Patient, No Pcp Per   Sharon Walton 49 y.o. female contacted via telephone for Medication Therapy Management visit today. Identified patient via name and DOB.  There were no vitals taken for this visit.  Patient Information   Past Medical History:  Diagnosis Date   Attention deficit disorder (ADD)    Depression    Hypertension    Panic attacks       Past Surgical History:  Procedure Laterality Date   ABDOMINAL HYSTERECTOMY     CHOLECYSTECTOMY       Family History  Problem Relation Age of Onset   Diabetes Mother    Hypertension Mother    Cancer Father    Lupus Sister    Aneurysm Sister    SIDS Sister     New Diagnoses (since last visit):    Family Support: None            Social History   Substance and Sexual Activity  Alcohol Use No      Social History   Tobacco Use  Smoking Status Current Every Day Smoker   Types: Cigarettes  Smokeless Tobacco Never Used  Tobacco Comment   down to 2 cigarettes / day       Health Maintenance  Topic Date Due   Hepatitis C Screening  Never done   COVID-19 Vaccine (1) Never done   HIV Screening  Never done   TETANUS/TDAP  Never done   PAP SMEAR-Modifier  Never done   INFLUENZA VACCINE  02/04/2020    Health Maintenance/Date Completed  Last ED visit: 11/2019  Last Visit to PCP: 01/2020 Next Visit to PCP: does not currently have an appointment  Specialist Visit: no  Dental Exam: none recently  Eye Exam: 4 years ago  Pelvic/PAP Exam: none recently  Mammogram: 3-4 years ago  Flu Vaccine: no; does not plan on getting one  COVID-19 Vaccine: no    Outpatient Encounter Medications as of 03/13/2020  Medication Sig   Cyanocobalamin (B-12 PO) Take by mouth daily.   MELATONIN PO Take by mouth at bedtime.   FLUoxetine (PROZAC) 20 MG capsule Take 20 mg by mouth daily. Take 1 capsule every  morning (Patient not taking: Reported on 03/13/2020)   gabapentin (NEURONTIN) 100 MG capsule Take 1 capsule (100 mg total) by mouth 3 (three) times daily. (Patient not taking: Reported on 03/13/2020)   No facility-administered encounter medications on file as of 03/13/2020.      Assessment and Plan:  -Tobacco use: Patient cut down to 2 cigarettes/ day and 2 vape cartridges / month. Offered [employer] as a Theatre manager.   -Offered to send refill request for gabapentin. Patient declined. Encouraged patient to follow up with [facility] appointment on 9/13 regarding medication refills. Reminded patient that prescriptions can be sent to [facility].    RTC 1 year    Glean Salvo, Festus Aloe PharmD Student  Cosigned: Cleopatra Cedar. Dicky Doe, PharmD [facility] [place] Operations Coordinator [phone]

## 2020-04-15 ENCOUNTER — Emergency Department
Admission: EM | Admit: 2020-04-15 | Discharge: 2020-04-15 | Disposition: A | Discharge status: Home / Self Care | Payer: Self-pay | Attending: Emergency Medicine | Admitting: Emergency Medicine

## 2020-04-15 ENCOUNTER — Other Ambulatory Visit: Payer: Self-pay

## 2020-04-15 ENCOUNTER — Emergency Department: Payer: Self-pay

## 2020-04-15 DIAGNOSIS — Z20822 Contact with and (suspected) exposure to covid-19: Secondary | ICD-10-CM | POA: Insufficient documentation

## 2020-04-15 DIAGNOSIS — F17210 Nicotine dependence, cigarettes, uncomplicated: Secondary | ICD-10-CM | POA: Insufficient documentation

## 2020-04-15 DIAGNOSIS — R531 Weakness: Secondary | ICD-10-CM | POA: Insufficient documentation

## 2020-04-15 DIAGNOSIS — R0602 Shortness of breath: Secondary | ICD-10-CM | POA: Insufficient documentation

## 2020-04-15 DIAGNOSIS — Z79899 Other long term (current) drug therapy: Secondary | ICD-10-CM | POA: Insufficient documentation

## 2020-04-15 DIAGNOSIS — R059 Cough, unspecified: Secondary | ICD-10-CM | POA: Insufficient documentation

## 2020-04-15 DIAGNOSIS — R0981 Nasal congestion: Secondary | ICD-10-CM | POA: Insufficient documentation

## 2020-04-15 DIAGNOSIS — I10 Essential (primary) hypertension: Secondary | ICD-10-CM | POA: Insufficient documentation

## 2020-04-15 LAB — RESPIRATORY PANEL BY RT PCR (FLU A&B, COVID)
Influenza A by PCR: NEGATIVE
Influenza B by PCR: NEGATIVE
SARS Coronavirus 2 by RT PCR: NEGATIVE

## 2020-04-15 MED ORDER — DEXAMETHASONE SODIUM PHOSPHATE 10 MG/ML IJ SOLN: 10.0000 mg | Freq: Once | INTRAMUSCULAR | Status: DC

## 2020-04-15 NOTE — ED Provider Notes (Signed)
[facility] Emergency Department Provider Note  ____________________________________________   First MD Initiated Contact with Patient 04/15/20 1011     (approximate)  I have reviewed the triage vital signs and the nursing notes.   HISTORY  Chief Complaint URI    HPI Sharon Walton is a 49 y.o. female presents emergency department with cough, congestion, and weakness for 2 weeks.  States that she is also been short of breath.  Roommates had same type symptoms but did not get tested for Covid.  She states "they are all drug addicts and none of them wanted to get tested ".  She denies fever or chills.  She is not taking anything for her symptoms.    Past Medical History:  Diagnosis Date  . Attention deficit disorder (ADD)   . Depression   . Hypertension   . Panic attacks     Patient Active Problem List   Diagnosis Date Noted  . Encounter to establish care 01/25/2020  . Chronic low back pain 01/25/2020  . Chronic pain of right knee 01/25/2020  . Depressive disorder 11/13/2019    Past Surgical History:  Procedure Laterality Date  . ABDOMINAL HYSTERECTOMY    . CHOLECYSTECTOMY      Prior to Admission medications   Medication Sig Start Date End Date Taking? Authorizing Provider  Cyanocobalamin (B-12 PO) Take by mouth daily.    [provider]  FLUoxetine (PROZAC) 20 MG capsule Take 20 mg by mouth daily. Take 1 capsule every morning Patient not taking: Reported on 03/13/2020    [provider]  gabapentin (NEURONTIN) 100 MG capsule Take 1 capsule (100 mg total) by mouth 3 (three) times daily. Patient not taking: Reported on 03/13/2020 01/25/20   Iloabachie, Chioma E, NP  MELATONIN PO Take by mouth at bedtime.    [provider]    Allergies Morphine  Family History  Problem Relation Age of Onset  . Diabetes Mother   . Hypertension Mother   . Cancer Father   . Lupus Sister   . Aneurysm Sister   . SIDS Sister      Social History Social History   Tobacco Use  . Smoking status: Current Every Day Smoker    Types: Cigarettes  . Smokeless tobacco: Never Used  . Tobacco comment: down to 2 cigarettes / day   Vaping Use  . Vaping Use: Some days  . Substances: Nicotine, Flavoring  Substance Use Topics  . Alcohol use: No  . Drug use: No    Review of Systems  Constitutional: No fever/chills Eyes: No visual changes. ENT: No sore throat. Respiratory: Positive cough and shortness of breath Cardiovascular: Denies chest pain Gastrointestinal: Denies abdominal pain Genitourinary: Negative for dysuria. Musculoskeletal: Negative for back pain. Skin: Negative for rash. Psychiatric: no mood changes,     ____________________________________________   PHYSICAL EXAM:  VITAL SIGNS: ED Triage Vitals  Enc Vitals Group     BP 04/15/20 0929 136/64     Pulse Rate 04/15/20 0929 95     Resp 04/15/20 0929 20     Temp 04/15/20 0929 99.2 F (37.3 C)     Temp Source 04/15/20 0929 Oral     SpO2 04/15/20 0929 98 %     Weight 04/15/20 0930 200 lb (90.7 kg)     Height 04/15/20 0930 5\' 4"  (1.626 m)     Head Circumference --      Peak Flow --      Pain Score 04/15/20  0935 2     Pain Loc --      Pain Edu? --      Excl. in [place]? --     Constitutional: Alert and oriented. Well appearing and in no acute distress. Eyes: Conjunctivae are normal.  Head: Atraumatic. Nose: No congestion/rhinnorhea. Mouth/Throat: Mucous membranes are moist.   Neck:  supple no lymphadenopathy noted Cardiovascular: Normal rate, regular rhythm. Heart sounds are normal Respiratory: Normal respiratory effort.  No retractions, lungs c t a, cough is harsh Abd: soft nontender bs normal all 4 quad GU: deferred Musculoskeletal: FROM all extremities, warm and well perfused Neurologic:  Normal speech and language.  Skin:  Skin is warm, dry and intact. No rash noted. Psychiatric: Mood and affect are normal. Speech and behavior are  normal.  ____________________________________________   LABS (all labs ordered are listed, but only abnormal results are displayed)  Labs Reviewed  RESPIRATORY PANEL BY RT PCR (FLU A&B, COVID)  COMPREHENSIVE METABOLIC PANEL  CBC WITH DIFFERENTIAL/PLATELET  FIBRIN DERIVATIVES D-DIMER ([facility] ONLY)  URINALYSIS, COMPLETE (UACMP) WITH MICROSCOPIC  TROPONIN I (HIGH SENSITIVITY)  TROPONIN I (HIGH SENSITIVITY)   ____________________________________________   ____________________________________________  RADIOLOGY  Chest x-ray is normal  ____________________________________________   PROCEDURES  Procedure(s) performed: No  Procedures    ____________________________________________   INITIAL IMPRESSION / ASSESSMENT AND PLAN / ED COURSE  Pertinent labs & imaging results that were available during my care of the patient were reviewed by me and considered in my medical decision making (see chart for details).   Patient is a 49 year old female presents emergency department with cough, congestion, shortness of breath.  See HPI.  Physical exam shows patient appears stable.  Cough is deep and harsh.  DDx: Covid, acute bronchitis, MI, PE  Chest x-ray and Covid test are both negative  Patient eloped from the exam room prior to labs being drawn.  No one saw her leave the exam room.     Sharon Walton was evaluated in Emergency Department on 04/15/2020 for the symptoms described in the history of present illness. She was evaluated in the context of the global COVID-19 pandemic, which necessitated consideration that the patient might be at risk for infection with the SARS-CoV-2 virus that causes COVID-19. Institutional protocols and algorithms that pertain to the evaluation of patients at risk for COVID-19 are in a state of rapid change based on information released by regulatory bodies including the CDC and federal and state organizations. These policies and algorithms were  followed during the patient's care in the ED.    As part of my medical decision making, I reviewed the following data within the [place] notes reviewed and incorporated, Labs reviewed , Old chart reviewed, Radiograph reviewed , Notes from prior ED visits and [place] Controlled Substance Database  ____________________________________________   FINAL CLINICAL IMPRESSION(S) / ED DIAGNOSES  Final diagnoses:  Suspected COVID-19 virus infection  Shortness of breath      NEW MEDICATIONS STARTED DURING THIS VISIT:  Discharge Medication List as of 04/15/2020 11:51 AM       Note:  This document was prepared using Dragon voice recognition software and may include unintentional dictation errors.    Versie Starks, PA-C 04/15/20 1315    Duffy Bruce, MD 04/15/20 [phone]

## 2020-04-15 NOTE — ED Notes (Signed)
Pt not in treatment room, not in nearby restrooms, [facility] [place] notified

## 2020-04-15 NOTE — ED Notes (Signed)
See triage note- pt here with c/o productive cough with white mucus, nasal congestion, and fatigue x2 weeks. States she believes her room-mates were covid positive, she has not been tested.

## 2020-04-15 NOTE — ED Notes (Signed)
Pt not present in treatment room or nearby restrooms, [place] [facility] notified

## 2020-04-15 NOTE — ED Triage Notes (Signed)
Pt c/o cough with SOB and feeling fatigued for the past 2 weeks, states her room mate was having the same sx but states she was negative, pt has not been tested.

## 2020-04-18 ENCOUNTER — Ambulatory Visit: Payer: Medicaid Other | Admitting: Gerontology

## 2020-04-30 ENCOUNTER — Ambulatory Visit: Payer: Medicaid Other | Admitting: Gerontology

## 2020-05-14 ENCOUNTER — Other Ambulatory Visit: Payer: Self-pay

## 2020-05-14 ENCOUNTER — Ambulatory Visit: Payer: Self-pay | Admitting: Physician Assistant

## 2020-05-14 DIAGNOSIS — Z113 Encounter for screening for infections with a predominantly sexual mode of transmission: Secondary | ICD-10-CM

## 2020-05-14 DIAGNOSIS — Z87898 Personal history of other specified conditions: Secondary | ICD-10-CM

## 2020-05-14 LAB — WET PREP FOR TRICH, YEAST, CLUE
Trichomonas Exam: NEGATIVE
Yeast Exam: NEGATIVE

## 2020-05-15 ENCOUNTER — Encounter: Payer: Self-pay | Admitting: Physician Assistant

## 2020-05-15 DIAGNOSIS — Z87898 Personal history of other specified conditions: Secondary | ICD-10-CM | POA: Insufficient documentation

## 2020-05-15 NOTE — Progress Notes (Signed)
[facility] Department STI clinic/screening visit  Subjective:  Sharon Walton is a 49 y.o. female being seen today for an STI screening visit. The patient reports they do not have symptoms.  Patient reports that they do not desire a pregnancy in the next year.   They reported they are not interested in discussing contraception today.  No LMP recorded. Patient has had a hysterectomy.   Patient has the following medical conditions:   Patient Active Problem List   Diagnosis Date Noted  . Encounter to establish care 01/25/2020  . Chronic low back pain 01/25/2020  . Chronic pain of right knee 01/25/2020  . Depressive disorder 11/13/2019    Chief Complaint  Patient presents with  . SEXUALLY TRANSMITTED DISEASE    screening    HPI  Patient reports that she is not having any symptoms but has a new partner and wants to be screened.  Reports that she has a history of HTN and depression but is not taking any medicines at this time.  Reports that she last had a HIV test 5 years ago and last pap was 2006, prior to her hysterectomy.    See flowsheet for further details and programmatic requirements.    The following portions of the patient's history were reviewed and updated as appropriate: allergies, current medications, past medical history, past social history, past surgical history and problem list.  Objective:  There were no vitals filed for this visit.  Physical Exam Constitutional:      General: She is not in acute distress.    Appearance: Normal appearance.  HENT:     Head: Normocephalic and atraumatic.     Comments: No nits,lice, or hair loss. No cervical, supraclavicular or axillary adenopathy.    Mouth/Throat:     Mouth: Mucous membranes are moist.     Pharynx: Oropharynx is clear. No oropharyngeal exudate or posterior oropharyngeal erythema.     Comments: Patient with partial dental plate in upper mouth. Eyes:     Conjunctiva/sclera: Conjunctivae normal.   Pulmonary:     Effort: Pulmonary effort is normal.  Abdominal:     Palpations: Abdomen is soft. There is no mass.     Tenderness: There is no abdominal tenderness. There is no guarding or rebound.  Genitourinary:    General: Normal vulva.     Rectum: Normal.     Comments: External genitalia/pubic area without nits, lice, edema, erythema, lesions and inguinal adenopathy. Vagina with normal mucosa and discharge. Uterus and cervix surgically absent.  Bimanual normal and without masses. Musculoskeletal:     Cervical back: Neck supple. No tenderness.  Skin:    General: Skin is warm and dry.     Findings: No bruising, erythema, lesion or rash.  Neurological:     Mental Status: She is alert and oriented to person, place, and time.  Psychiatric:        Mood and Affect: Mood normal.        Behavior: Behavior normal.        Thought Content: Thought content normal.        Judgment: Judgment normal.      Assessment and Plan:  Sharon Walton is a 49 y.o. female presenting to the [facility] Department for STI screening  1. Screening for STD (sexually transmitted disease) Patient into clinic without symptoms. Rec condoms with all sex. Await test results.  Counseled that RN will call if needs to RTC for treatment once results are back. -  WET PREP FOR TRICH, YEAST, CLUE - Chlamydia/Gonorrhea [facility] - HBV Antigen/Antibody [facility] - HIV/HCV [facility] - Syphilis Serology, [facility]     No follow-ups on file.  No future appointments.  Jerene Dilling, PA

## 2020-06-03 ENCOUNTER — Other Ambulatory Visit: Payer: Self-pay | Admitting: Psychiatry

## 2020-06-03 NOTE — Progress Notes (Signed)
    NO SHOW for appointment for new patient. 06/04/2020.

## 2020-06-04 ENCOUNTER — Ambulatory Visit (INDEPENDENT_AMBULATORY_CARE_PROVIDER_SITE_OTHER): Payer: Medicaid Other | Admitting: Adult Health

## 2020-06-04 DIAGNOSIS — Z91199 Patient's noncompliance with other medical treatment and regimen due to unspecified reason: Secondary | ICD-10-CM | POA: Insufficient documentation

## 2020-06-04 DIAGNOSIS — Z5329 Procedure and treatment not carried out because of patient's decision for other reasons: Secondary | ICD-10-CM

## 2020-08-16 ENCOUNTER — Other Ambulatory Visit: Payer: Self-pay | Admitting: Psychiatry

## 2020-09-09 ENCOUNTER — Other Ambulatory Visit: Payer: Self-pay | Admitting: Psychiatry

## 2020-09-11 ENCOUNTER — Ambulatory Visit: Payer: Self-pay | Admitting: Adult Health

## 2020-09-12 ENCOUNTER — Other Ambulatory Visit: Payer: Self-pay | Admitting: Psychiatry

## 2020-10-03 ENCOUNTER — Other Ambulatory Visit: Payer: Self-pay | Admitting: Gerontology

## 2020-10-03 ENCOUNTER — Other Ambulatory Visit: Payer: Self-pay

## 2020-10-03 ENCOUNTER — Ambulatory Visit: Payer: Medicaid Other | Admitting: Gerontology

## 2020-10-03 ENCOUNTER — Encounter: Payer: Self-pay | Admitting: Gerontology

## 2020-10-03 VITALS — BP 103/60 | HR 79 | Ht 64.0 in | Wt 186.0 lb

## 2020-10-03 DIAGNOSIS — E669 Obesity, unspecified: Secondary | ICD-10-CM

## 2020-10-03 DIAGNOSIS — F339 Major depressive disorder, recurrent, unspecified: Secondary | ICD-10-CM

## 2020-10-03 DIAGNOSIS — M545 Low back pain, unspecified: Secondary | ICD-10-CM

## 2020-10-03 DIAGNOSIS — G8929 Other chronic pain: Secondary | ICD-10-CM

## 2020-10-03 DIAGNOSIS — Z0000 Encounter for general adult medical examination without abnormal findings: Secondary | ICD-10-CM

## 2020-10-03 MED ORDER — GABAPENTIN 100 MG PO CAPS: 100.0000 mg | ORAL_CAPSULE | Freq: Three times a day (TID) | ORAL | 2 refills | Status: DC

## 2020-10-03 NOTE — Progress Notes (Signed)
Established Patient Office Visit  Subjective:  Patient ID: Sharon Walton, female    DOB: [birth year]  Age: 50 y.o. MRN: [id number]  CC: No chief complaint on file.   HPI Sharon Walton is a 50 year old female who presents for follow up of knee pain. She has not been seen in clinic since July '21 but was referred to see [facility] orthopedist Dr. Vickki Hearing at time of visit for this problem, as well as low back pain. It does not appear that she followed up with Dr. Vickki Hearing. She reports ongoing, worsening pain of right knee. She denies any injury or traumatic event. She describes it as aching and throbbing. She feels as though it's negatively impacting her mobility and she had to stop working due to not being able to stand for long periods of time. She also has pain in her lower back, which has contributed as well. She denies saddle anesthesia or bowel/bladder incontinence. She does report occasional numbness and tingling to her feet. She was previously prescribed gabapentin but did not have any refills so has not been able to take this. She does feel as though the gabapentin helped with her pain as well as numbness/tingling. She denies any OTC medications at this time. She rates the pain as moderate to severe.   She has also been experience worsening of her depression. She denies any SI/HI. She feels down, sad, and frustrated all days of the week. She is currently taking Prozac, as prescribed, but feels as though it is not helping. Overall, she wants help addressing her depression and pain.   Past Medical History:  Diagnosis Date  . Attention deficit disorder (ADD)   . Depression   . Hypertension   . Panic attacks     Past Surgical History:  Procedure Laterality Date  . ABDOMINAL HYSTERECTOMY    . CHOLECYSTECTOMY      Family History  Problem Relation Age of Onset  . Diabetes Mother   . Hypertension Mother   . Cancer Father   . Lupus Sister   . Aneurysm Sister   . SIDS Sister      Social History   Socioeconomic History  . Marital status: Divorced    Spouse name: Not on file  . Number of children: Not on file  . Years of education: Not on file  . Highest education level: Not on file  Occupational History    Comment: [employer]  Tobacco Use  . Smoking status: Current Every Day Smoker    Types: Cigarettes  . Smokeless tobacco: Never Used  . Tobacco comment: down to 2 cigarettes / day   Vaping Use  . Vaping Use: Some days  . Substances: Nicotine, Flavoring  Substance and Sexual Activity  . Alcohol use: No  . Drug use: No  . Sexual activity: Yes    Partners: Male    Birth control/protection: Post-menopausal, Surgical  Other Topics Concern  . Not on file  Social History Narrative  . Not on file   Social Determinants of Health   Financial Resource Strain: Not on file  Food Insecurity: Not on file  Transportation Needs: Not on file  Physical Activity: Not on file  Stress: Not on file  Social Connections: Not on file  Intimate Partner Violence: Not on file    Outpatient Medications Prior to Visit  Medication Sig Dispense Refill  . Cyanocobalamin (B-12 PO) Take by mouth daily.    Marland Kitchen FLUoxetine (PROZAC) 20 MG capsule Take  20 mg by mouth daily. Take 1 capsule every morning (Patient not taking: Reported on 03/13/2020)    . gabapentin (NEURONTIN) 100 MG capsule Take 1 capsule (100 mg total) by mouth 3 (three) times daily. (Patient not taking: Reported on 03/13/2020) 90 capsule 0  . MELATONIN PO Take by mouth at bedtime.     No facility-administered medications prior to visit.    Allergies  Allergen Reactions  . Morphine Rash    urticaria    ROS Review of Systems  Constitutional: Negative.   HENT: Negative.   Respiratory: Negative.   Cardiovascular: Negative.   Gastrointestinal: Negative.   Genitourinary: Negative.   Musculoskeletal: Positive for arthralgias, back pain and myalgias.  Skin: Negative.   Neurological: Positive for  weakness and numbness. Negative for dizziness and light-headedness.  Psychiatric/Behavioral: Positive for dysphoric mood. Negative for suicidal ideas. The patient is nervous/anxious.       Objective:    Physical Exam Constitutional:      Appearance: Normal appearance. She is obese.  HENT:     Head: Normocephalic.     Mouth/Throat:     Mouth: Mucous membranes are moist.     Pharynx: Oropharynx is clear.  Eyes:     Conjunctiva/sclera: Conjunctivae normal.     Pupils: Pupils are equal, round, and reactive to light.  Cardiovascular:     Rate and Rhythm: Normal rate and regular rhythm.     Pulses: Normal pulses.     Heart sounds: Normal heart sounds.  Pulmonary:     Effort: Pulmonary effort is normal.     Breath sounds: Normal breath sounds.  Abdominal:     General: Bowel sounds are normal.     Palpations: Abdomen is soft.  Musculoskeletal:        General: No swelling or tenderness. Normal range of motion.     Cervical back: Normal range of motion and neck supple. No rigidity.     Lumbar back: No tenderness. Normal range of motion.     Right knee: No swelling. No tenderness.     Instability Tests: Anterior drawer test negative. Posterior drawer test negative.     Left knee: No swelling. No tenderness.     Instability Tests: Anterior drawer test negative. Posterior drawer test negative.     Right lower leg: No edema.     Left lower leg: No edema.  Skin:    General: Skin is warm and dry.     Capillary Refill: Capillary refill takes less than 2 seconds.  Neurological:     General: No focal deficit present.     Mental Status: She is alert and oriented to person, place, and time. Mental status is at baseline.     Sensory: Sensation is intact.     Motor: Motor function is intact. No weakness.     Gait: Gait normal.  Psychiatric:        Mood and Affect: Mood is anxious and depressed.        Speech: Speech normal.        Behavior: Behavior is cooperative.        Thought Content:  Thought content normal. Thought content does not include homicidal or suicidal ideation.        Judgment: Judgment normal.   PHQ-9 25 GAD-7  21  There were no vitals taken for this visit. Wt Readings from Last 3 Encounters:  04/15/20 200 lb (90.7 kg)  01/25/20 201 lb (91.2 kg)  11/13/19 190 lb (86.2 kg)  Health Maintenance Due  Topic Date Due  . Hepatitis C Screening  Never done  . COVID-19 Vaccine (1) Never done  . HIV Screening  Never done  . TETANUS/TDAP  Never done  . PAP SMEAR-Modifier  Never done  . COLONOSCOPY (Pts 45-44yr Insurance coverage will need to be confirmed)  Never done  . INFLUENZA VACCINE  02/04/2020    There are no preventive care reminders to display for this patient.  Lab Results  Component Value Date   TSH 3.38 09/28/2011   Lab Results  Component Value Date   WBC 10.7 (H) 11/13/2019   HGB 11.9 (L) 11/13/2019   HCT 36.0 11/13/2019   MCV 80.9 11/13/2019   PLT 336 11/13/2019   Lab Results  Component Value Date   NA 139 11/13/2019   K 3.2 (L) 11/13/2019   CO2 25 11/13/2019   GLUCOSE 103 (H) 11/13/2019   BUN 9 11/13/2019   CREATININE 0.78 11/13/2019   BILITOT 0.4 11/13/2019   ALKPHOS 98 11/13/2019   AST 20 11/13/2019   ALT 14 11/13/2019   PROT 7.8 11/13/2019   ALBUMIN 4.1 11/13/2019   CALCIUM 8.9 11/13/2019   ANIONGAP 8 11/13/2019   No results found for: CHOL No results found for: HDL No results found for: LDLCALC No results found for: TRIG No results found for: CHOLHDL No results found for: HGBA1C    Assessment & Plan:   1. Obesity (BMI 30-39.9) Encouraged exercise (150 min/week) as tolerated. Avoid fried/greasy foods and monitor caloric intake. Increase water intake. Avoid sugary drinks/foods. Weight loss encouraged as this could help with pain as well.  - Lipid Profile; Future - HgB A1c; Future  2. Healthcare maintenance Mammogram and pap smear ordered - pt to be contacted for scheduling. Colonoscopy for colon cancer  screening ordered.  - Comp Met (CMET); Future - CBC w/Diff; Future - Ambulatory referral to Gastroenterology  3. Major depression, recurrent, chronic (HCC) Worsening of depression. [facility] continue on current medication regimen [facility]. No SI/HI. Agreed to notify if either develops or go to the ED. Crisis line provided. Labs to rule out underlying etiology.  - CBC w/Diff; Future  4. Chronic low back pain, unspecified back pain laterality, unspecified whether sciatica present No emergent etiology indicated upon exam. [facility] further evaluation and management. - gabapentin (NEURONTIN) 100 MG capsule; Take 1 capsule (100 mg total) by mouth 3 (three) times daily.  Dispense: 90 capsule; Refill: 2  5. Chronic pain of right [facility] Hearingfor further evaluation and management. Weight loss encouraged. Low-impact exercise encouraged. OTC Voltaren gel recommended.  - gabapentin (NEURONTIN) 100 MG capsule; Take 1 capsule (100 mg total) by mouth 3 (three) times daily.  Dispense: 90 capsule; Refill: 2    Follow-up: Return in one month (10/31/20), or if symptoms worsen or do not improve.    KClayton Bibles RN, BSN, FNP-S

## 2020-10-03 NOTE — Patient Instructions (Signed)
Major Depressive Disorder, Adult Major depressive disorder is a mental health condition. This disorder affects feelings. It can also affect the body. Symptoms of this condition last most of the day, almost every day, for 2 weeks. This disorder can affect:  Relationships.  Daily activities, such as work and school.  Activities that you normally like to do. What are the causes? The cause of this condition is not known. The disorder is likely caused by a mix of things, including:  Your personality, such as being a shy person.  Your behavior, or how you act toward others.  Your thoughts and feelings.  Too much alcohol or drugs.  How you react to stress.  Health and mental problems that you have had for a long time.  Things that hurt you in the past (trauma).  Big changes in your life, such as divorce. What increases the risk? The following factors may make you more likely to develop this condition:  Having family members with depression.  Being a woman.  Problems in the family.  Low levels of some brain chemicals.  Things that caused you pain as a child, especially if you lost a parent or were abused.  A lot of stress in your life, such as from: ? Living without basic needs of life, such as food and shelter. ? Being treated poorly because of race, sex, or religion (discrimination).  Health and mental problems that you have had for a long time. What are the signs or symptoms? The main symptoms of this condition are:  Being sad all the time.  Being grouchy all the time.  Loss of interest in things and activities. Other symptoms include:  Sleeping too much or too little.  Eating too much or too little.  Gaining or losing weight, without knowing why.  Feeling tired or having low energy.  Being restless and weak.  Feeling hopeless, worthless, or guilty.  Trouble thinking clearly or making decisions.  Thoughts of hurting yourself or others, or thoughts of  ending your life.  Spending a lot of time alone.  Inability to complete common tasks of daily life. If you have very bad MDD, you may:  Believe things that are not true.  Hear, see, taste, or feel things that are not there.  Have mild depression that lasts for at least 2 years.  Feel very sad and hopeless.  Have trouble speaking or moving. How is this treated? This condition may be treated with:  Talk therapy. This teaches you to know bad thoughts, feelings, and actions and how to change them. ? This can also help you to communicate with others. ? This can be done with members of your family.  Medicines. These can be used to treat worry (anxiety), depression, or low levels of chemicals in the brain.  Lifestyle changes. You may need to: ? Limit alcohol use. ? Limit drug use. ? Get regular exercise. ? Get plenty of sleep. ? Make healthy eating choices. ? Spend more time outdoors.  Brain stimulation. This treatment excites the brain. This is done when symptoms are very bad or have not gotten better with other treatments. Follow these instructions at home: Activity  Get regular exercise as told.  Spend time outdoors as told.  Make time to do the things you enjoy.  Find ways to deal with stress. Try to: ? Meditate. ? Do deep breathing. ? Spend time in nature. ? Keep a journal.  Return to your normal activities as told by your doctor.   Ask your doctor what activities are safe for you. Alcohol and drug use  If you drink alcohol: ? Limit how much you use to:  0-1 drink a day for women.  0-2 drinks a day for men. ? Be aware of how much alcohol is in your drink. In the [place] one drink equals one 12 oz bottle of beer (355 mL), one 5 oz glass of wine (148 mL), or one 1 oz glass of hard liquor (44 mL).  Talk to your doctor about: ? Alcohol use. Alcohol can affect some medicines. ? Any drug use. General instructions  Take over-the-counter and prescription medicines  and herbal preparations only as told by your doctor.  Eat a healthy diet.  Get a lot of sleep.  Think about joining a support group. Your doctor may be able to suggest one.  Keep all follow-up visits as told by your doctor. This is important.   Where to find more information:  National Alliance on Mental Illness: www.nami.org  [place] National Institute of Mental Health: www.nimh.nih.gov  American Psychiatric Association: www.psychiatry.org/patients-families/ Contact a doctor if:  Your symptoms get worse.  You get new symptoms. Get help right away if:  You hurt yourself.  You have serious thoughts about hurting yourself or others.  You see, hear, taste, smell, or feel things that are not there. If you ever feel like you may hurt yourself or others, or have thoughts about taking your own life, get help right away. Go to your nearest emergency department or:  Call your local emergency services (911 in the [place]  Call a suicide crisis helpline, such as the National Suicide Prevention Lifeline at 1-800-273-8255. This is open 24 hours a day in the [place]  Text the Crisis Text Line at 741741 (in the [place] Summary  Major depressive disorder is a mental health condition. This disorder affects feelings. Symptoms of this condition last most of the day, almost every day, for 2 weeks.  The symptoms of this disorder can cause problems with relationships and with daily activities.  There are treatments and support for people who get this disorder. You may need more than one type of treatment.  Get help right away if you have serious thoughts about hurting yourself or others. This information is not intended to replace advice given to you by your health care provider. Make sure you discuss any questions you have with your health care provider. Document Revised: 06/03/2019 Document Reviewed: 06/03/2019 Elsevier Patient Education  2021 Elsevier Inc.  

## 2020-10-08 ENCOUNTER — Ambulatory Visit: Payer: Medicaid Other | Admitting: Specialist

## 2020-10-23 ENCOUNTER — Other Ambulatory Visit: Payer: Medicaid Other

## 2020-10-23 ENCOUNTER — Encounter: Payer: Self-pay | Admitting: *Deleted

## 2020-10-29 ENCOUNTER — Ambulatory Visit: Payer: No Typology Code available for payment source | Attending: Oncology

## 2020-10-31 ENCOUNTER — Ambulatory Visit: Payer: Medicaid Other | Admitting: Gerontology

## 2020-11-21 ENCOUNTER — Other Ambulatory Visit: Payer: Self-pay

## 2020-11-25 ENCOUNTER — Other Ambulatory Visit: Payer: Self-pay

## 2020-11-25 MED FILL — Gabapentin Cap 100 MG: ORAL | 30 days supply | Qty: 90 | Fill #0 | Status: AC

## 2020-11-26 ENCOUNTER — Other Ambulatory Visit: Payer: Self-pay

## 2020-12-10 ENCOUNTER — Telehealth: Payer: Self-pay | Admitting: Licensed Clinical Social Worker

## 2020-12-10 NOTE — Telephone Encounter (Signed)
Called the patient to schedule a 60 minute [facility] consultation visit. No answer; unable to leave a voicemail

## 2020-12-17 NOTE — Telephone Encounter (Signed)
Called the patient to schedule a 60 minute [facility] consultation visit. No answer; unable to leave a voicemail

## 2020-12-23 ENCOUNTER — Emergency Department: Payer: Medicaid Other

## 2020-12-23 ENCOUNTER — Other Ambulatory Visit: Payer: Self-pay

## 2020-12-23 DIAGNOSIS — R55 Syncope and collapse: Secondary | ICD-10-CM | POA: Insufficient documentation

## 2020-12-23 DIAGNOSIS — I10 Essential (primary) hypertension: Secondary | ICD-10-CM | POA: Insufficient documentation

## 2020-12-23 DIAGNOSIS — G8929 Other chronic pain: Secondary | ICD-10-CM | POA: Insufficient documentation

## 2020-12-23 DIAGNOSIS — R519 Headache, unspecified: Secondary | ICD-10-CM | POA: Insufficient documentation

## 2020-12-23 DIAGNOSIS — F1510 Other stimulant abuse, uncomplicated: Secondary | ICD-10-CM | POA: Insufficient documentation

## 2020-12-23 DIAGNOSIS — F17210 Nicotine dependence, cigarettes, uncomplicated: Secondary | ICD-10-CM | POA: Insufficient documentation

## 2020-12-23 DIAGNOSIS — Z20822 Contact with and (suspected) exposure to covid-19: Secondary | ICD-10-CM | POA: Insufficient documentation

## 2020-12-23 DIAGNOSIS — Z5900 Homelessness unspecified: Secondary | ICD-10-CM | POA: Insufficient documentation

## 2020-12-23 DIAGNOSIS — F331 Major depressive disorder, recurrent, moderate: Secondary | ICD-10-CM | POA: Insufficient documentation

## 2020-12-23 DIAGNOSIS — M545 Low back pain, unspecified: Secondary | ICD-10-CM | POA: Insufficient documentation

## 2020-12-23 NOTE — ED Triage Notes (Addendum)
Pt in with co headache that started today but states had syncopal episode x 2 several days ago. States did not see MD at that time. Pt also ran out of antidepressants and wants to be evaluated for depression. Denies any HI or SI at this time. Pt refusing blood work at this time.

## 2020-12-24 ENCOUNTER — Emergency Department
Admission: EM | Admit: 2020-12-24 | Discharge: 2020-12-25 | Disposition: A | Discharge status: Home / Self Care | Payer: Self-pay | Attending: Emergency Medicine | Admitting: Emergency Medicine

## 2020-12-24 DIAGNOSIS — F32A Depression, unspecified: Secondary | ICD-10-CM | POA: Diagnosis present

## 2020-12-24 DIAGNOSIS — G8929 Other chronic pain: Secondary | ICD-10-CM | POA: Diagnosis present

## 2020-12-24 DIAGNOSIS — F1510 Other stimulant abuse, uncomplicated: Secondary | ICD-10-CM

## 2020-12-24 DIAGNOSIS — Z5900 Homelessness unspecified: Secondary | ICD-10-CM

## 2020-12-24 DIAGNOSIS — F331 Major depressive disorder, recurrent, moderate: Secondary | ICD-10-CM

## 2020-12-24 DIAGNOSIS — R55 Syncope and collapse: Secondary | ICD-10-CM

## 2020-12-24 DIAGNOSIS — R519 Headache, unspecified: Secondary | ICD-10-CM

## 2020-12-24 DIAGNOSIS — F329 Major depressive disorder, single episode, unspecified: Secondary | ICD-10-CM

## 2020-12-24 DIAGNOSIS — M545 Low back pain, unspecified: Secondary | ICD-10-CM | POA: Diagnosis present

## 2020-12-24 LAB — RESP PANEL BY RT-PCR (FLU A&B, COVID) ARPGX2
Influenza A by PCR: NEGATIVE
Influenza B by PCR: NEGATIVE
SARS Coronavirus 2 by RT PCR: NEGATIVE

## 2020-12-24 MED ORDER — LORAZEPAM 1 MG PO TABS
1.0000 mg | ORAL_TABLET | Freq: Once | ORAL | Status: AC
  Administered 2020-12-24: 1 mg via ORAL
  Filled 2020-12-24: qty 1

## 2020-12-24 NOTE — ED Notes (Signed)
VOL/Consult ordered/ Moved to [facility]

## 2020-12-24 NOTE — ED Notes (Signed)
Hourly rounding performed, patient currently asleep in room. Patient has no complaints at this time. Q15 minute rounds and monitoring via [employer] to continue.

## 2020-12-24 NOTE — ED Provider Notes (Signed)
-----------------------------------------   12:13 PM on 12/24/2020 ----------------------------------------- Patient has been seen and evaluated by psychiatry.  They have been cleared from a psychiatric standpoint and safe for discharge home.  However as the patient is currently homeless with social issues we will consult social work for help with disposition.   Harvest Dark, MD 12/24/20 1214

## 2020-12-24 NOTE — Consult Note (Signed)
[facility] Consult   Reason for Consult: Consult for this 50 year old woman with a history of substance abuse and depression who came to the hospital initially with medical complaints Referring Physician: Paduchowski Patient Identification: Sharon Walton MRN:  [id number] Principal Diagnosis: Depressive disorder Diagnosis:  Principal Problem:   Depressive disorder Active Problems:   Chronic low back pain   Methamphetamine abuse ([place])   Homelessness   Total Time spent with patient: 1 hour  Subjective:   Sharon Walton is a 49 y.o. female patient admitted with "I have been really depressed".  HPI: Patient seen chart reviewed.  Patient came to the emergency room initially complaining of a loss of consciousness and fall.  Later talked about being depressed and made some vaguely suicidal comments.  On interview today the patient says she is "really depressed".  She says she has been that way for about 12 years.  She admits that it has been worse recently but by recently she means perhaps the last 6 months.  Mood stays anxious and down much of the time.  She feels like she is under an overwhelming amount of stress.  She currently has no place to stay except living in her truck.  Family allegedly will not have anything to do with her.  No income.  Not eating well.  Patient admits to using methamphetamine although she minimizes the amount of it.  She currently denies any auditory or visual hallucinations.  She denies any suicidal ideation.  Denies homicidal ideation.  Patient presents as disheveled sad appearing.  Now physically more stable.  She says that she does go to [place] and sees Dr. Jamse Arn and has been prescribed Prozac and trazodone but insists that it has been of no benefit to her and she does not want to take it anymore.  Past Psychiatric History: Patient has a past history of depression and substance abuse.  She reports she had a positive suicide attempt by overdose  of gabapentin about a year ago.  He has had prior psychiatric hospitalizations.  Currently going to [place] to Self:   Risk to Others:   Prior Inpatient Therapy:   Prior Outpatient Therapy:    Past Medical History:  Past Medical History:  Diagnosis Date   Attention deficit disorder (ADD)    Depression    Hypertension    Panic attacks     Past Surgical History:  Procedure Laterality Date   ABDOMINAL HYSTERECTOMY     CHOLECYSTECTOMY     Family History:  Family History  Problem Relation Age of Onset   Diabetes Mother    Hypertension Mother    Cancer Father    Lupus Sister    Aneurysm Sister    SIDS Sister    Family Psychiatric  History: No known family history Social History:  Social History   Substance and Sexual Activity  Alcohol Use No     Social History   Substance and Sexual Activity  Drug Use No    Social History   Socioeconomic History   Marital status: Divorced    Spouse name: Not on file   Number of children: Not on file   Years of education: Not on file   Highest education level: Not on file  Occupational History    Comment: [employer]  Tobacco Use   Smoking status: Every Day    Pack years: 0.00    Types: Cigarettes   Smokeless tobacco: Never   Tobacco comments:  down to 2 cigarettes / day   Vaping Use   Vaping Use: Some days   Substances: Nicotine, Flavoring  Substance and Sexual Activity   Alcohol use: No   Drug use: No   Sexual activity: Yes    Partners: Male    Birth control/protection: Post-menopausal, Surgical  Other Topics Concern   Not on file  Social History Narrative   Not on file   Social Determinants of Health   Financial Resource Strain: Not on file  Food Insecurity: Not on file  Transportation Needs: Not on file  Physical Activity: Not on file  Stress: Not on file  Social Connections: Not on file   Additional Social History:    Allergies:   Allergies  Allergen Reactions   Morphine Rash     urticaria    Labs:  Results for orders placed or performed during the hospital encounter of 12/24/20 (from the past 48 hour(s))  Resp Panel by RT-PCR (Flu A&B, Covid) Nasopharyngeal Swab     Status: None   Collection Time: 12/24/20  2:47 AM   Specimen: Nasopharyngeal Swab; Nasopharyngeal(NP) swabs in vial transport medium  Result Value Ref Range   SARS Coronavirus 2 by RT PCR NEGATIVE NEGATIVE    Comment: (NOTE) SARS-CoV-2 target nucleic acids are NOT DETECTED.  The SARS-CoV-2 RNA is generally detectable in upper respiratory specimens during the acute phase of infection. The lowest concentration of SARS-CoV-2 viral copies this assay can detect is 138 copies/mL. A negative result does not preclude SARS-Cov-2 infection and should not be used as the sole basis for treatment or other patient management decisions. A negative result may occur with  improper specimen collection/handling, submission of specimen other than nasopharyngeal swab, presence of viral mutation(s) within the areas targeted by this assay, and inadequate number of viral copies(<138 copies/mL). A negative result must be combined with clinical observations, patient history, and epidemiological information. The expected result is Negative.  Fact Sheet for Patients:  EntrepreneurPulse.com.au  Fact Sheet for Healthcare Providers:  IncredibleEmployment.be  This test is no t yet approved or cleared by the [place] FDA and  has been authorized for detection and/or diagnosis of SARS-CoV-2 by FDA under an Emergency Use Authorization (EUA). This EUA will remain  in effect (meaning this test can be used) for the duration of the COVID-19 declaration under Section 564(b)(1) of the Act, 21 U.S.C.section 360bbb-3(b)(1), unless the authorization is terminated  or revoked sooner.       Influenza A by PCR NEGATIVE NEGATIVE   Influenza B by PCR NEGATIVE NEGATIVE    Comment: (NOTE) The  Xpert Xpress SARS-CoV-2/FLU/RSV plus assay is intended as an aid in the diagnosis of influenza from Nasopharyngeal swab specimens and should not be used as a sole basis for treatment. Nasal washings and aspirates are unacceptable for Xpert Xpress SARS-CoV-2/FLU/RSV testing.  Fact Sheet for Patients: EntrepreneurPulse.com.au  Fact Sheet for Healthcare Providers: IncredibleEmployment.be  This test is not yet approved or cleared by the [place] FDA and has been authorized for detection and/or diagnosis of SARS-CoV-2 by FDA under an Emergency Use Authorization (EUA). This EUA will remain in effect (meaning this test can be used) for the duration of the COVID-19 declaration under Section 564(b)(1) of the Act, 21 U.S.C. section 360bbb-3(b)(1), unless the authorization is terminated or revoked.  Performed at [facility], [address] [place], [place] [address]     No current facility-administered medications for this encounter.   Current Outpatient Medications  Medication Sig Dispense  Refill   Cyanocobalamin (B-12 PO) Take 1 tablet by mouth daily.     FLUoxetine (PROZAC) 40 MG capsule TAKE ONE CAPSULE BY MOUTH EVERY MORNING 30 capsule 1   gabapentin (NEURONTIN) 100 MG capsule TAKE ONE CAPSULE BY MOUTH 3 TIMES A DAY 90 capsule 2   hydrOXYzine (ATARAX/VISTARIL) 25 MG tablet TAKE 1 OR 2 TALETS BY MOUTH AT BEDTIME AS NEEDED 14 tablet 0   MELATONIN PO Take 5 mg by mouth at bedtime.      Musculoskeletal: Strength & Muscle Tone: within normal limits Gait & Station: normal Patient leans: N/A            Psychiatric Specialty Exam:  Presentation  General Appearance:  No data recorded Eye Contact: No data recorded Speech: No data recorded Speech Volume: No data recorded Handedness: No data recorded  Mood and Affect  Mood: No data recorded Affect: No data recorded  Thought Process  Thought Processes: No data  recorded Descriptions of Associations:No data recorded Orientation:No data recorded Thought Content:No data recorded History of Schizophrenia/Schizoaffective disorder:No  Duration of Psychotic Symptoms:No data recorded Hallucinations:No data recorded Ideas of Reference:No data recorded Suicidal Thoughts:No data recorded Homicidal Thoughts:No data recorded  Sensorium  Memory: No data recorded Judgment: No data recorded Insight: No data recorded  Executive Functions  Concentration: No data recorded Attention Span: No data recorded Recall: No data recorded Fund of Knowledge: No data recorded Language: No data recorded  Psychomotor Activity  Psychomotor Activity: No data recorded  Assets  Assets: No data recorded  Sleep  Sleep: No data recorded  Physical Exam: Physical Exam Vitals and nursing note reviewed.  Constitutional:      Appearance: Normal appearance.  HENT:     Head: Normocephalic and atraumatic.     Mouth/Throat:     Pharynx: Oropharynx is clear.  Eyes:     Pupils: Pupils are equal, round, and reactive to light.  Cardiovascular:     Rate and Rhythm: Normal rate and regular rhythm.  Pulmonary:     Effort: Pulmonary effort is normal.     Breath sounds: Normal breath sounds.  Abdominal:     General: Abdomen is flat.     Palpations: Abdomen is soft.  Musculoskeletal:        General: Normal range of motion.  Skin:    General: Skin is warm and dry.  Neurological:     General: No focal deficit present.     Mental Status: She is alert. Mental status is at baseline.  Psychiatric:        Attention and Perception: She is inattentive.        Mood and Affect: Mood is anxious and depressed. Affect is tearful.        Speech: Speech is delayed.        Behavior: Behavior is slowed.        Thought Content: Thought content normal. Thought content is not paranoid or delusional. Thought content does not include homicidal or suicidal ideation.         Cognition and Memory: Cognition normal.        Judgment: Judgment is impulsive.   Review of Systems  Constitutional: Negative.   HENT: Negative.    Eyes: Negative.   Respiratory: Negative.    Cardiovascular: Negative.   Gastrointestinal: Negative.   Musculoskeletal: Negative.   Skin: Negative.   Neurological: Negative.   Psychiatric/Behavioral:  Positive for depression and substance abuse. Negative for hallucinations and suicidal ideas. The patient is nervous/anxious and  has insomnia.  He Blood pressure 104/72, pulse 72, temperature 98 F (36.7 C), temperature source Oral, resp. rate 16, height 5\' 4"  (1.626 m), weight 74.8 kg, SpO2 97 %. Body mass index is 28.32 kg/m.  Treatment Plan Summary: Plan 50 year old woman with chronic depression and poor self-care active substance abuse.  Currently denies suicidal ideation.  Does not show any psychotic symptoms.  Patient does not meet criteria for inpatient hospitalization.  Does not meet commitment criteria.  Talked for a while with her about her homelessness reviewing various options of places to stay.  Patient continues to insist that no family members are approachable.  I offered to write her a prescription for the Prozac and trazodone or to discuss other medicines that have been helpful in the past but she insists that the only thing that ever helped her were prescription stimulants and I told her we would not be able to start those from the emergency room.  Patient encouraged to continue following up with [facility] and has their contact information.  I have placed a consult to [facility] to see if they have any recommendations about a living situation for this patient.  Disposition: No evidence of imminent risk to self or others at present.   Patient does not meet criteria for psychiatric inpatient admission. Supportive therapy provided about ongoing stressors. Discussed crisis plan, support from social network, calling 911, coming to the Emergency  Department, and calling Suicide Hotline.  Alethia Berthold, MD 12/24/2020 3:04 PM

## 2020-12-24 NOTE — ED Notes (Signed)
Patient refused blood work at this time.

## 2020-12-24 NOTE — ED Notes (Addendum)
Attempted to draw labs on pt but pt is refusing stating she doesn't do needles. Educated that labs needed for POC to be determined, pt verbalized understanding of this but continues to refuse. Accepted urine cup and states she will give urine specimen when she pees.

## 2020-12-24 NOTE — ED Notes (Signed)
VS assess. Shower offered. No other needs found a this moment. Breakfast tray given.

## 2020-12-24 NOTE — ED Notes (Signed)
Pt. Transferred from room 16 to room 2 after dressing out and screening for contraband. Report to include Situation, Background, Assessment and Recommendations from [facility]. Pt. Oriented to Quad including Q15 minute rounds as well as Engineer, drilling for their protection. Patient is alert and oriented, warm and dry in no acute distress. Patient denies SI, HI, and AVH. Patient is depressed. Pt. Encouraged to let me know if needs arise.

## 2020-12-24 NOTE — ED Notes (Signed)
Hourly rounding reveals patient in room. No complaints, stable, in no acute distress. Q15 minute rounds and monitoring via Security Cameras to continue. 
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
## 2020-12-24 NOTE — ED Notes (Signed)
Pt continues to refuse blood work, states she does not want to be poked with a needle. Pt does acknowledge that urine is needed and states that when she needs to go she will provide sample.

## 2020-12-24 NOTE — Consult Note (Signed)
Brief note.  Full note to follow.  Patient seen and chart reviewed.  Patient does not meet commitment criteria or require inpatient hospital level treatment.  She is homeless and very sad about her situation with almost no resources.  Case reviewed with ER physician.  Consult made to social work.

## 2020-12-24 NOTE — ED Provider Notes (Signed)
[facility] Emergency Department Provider Note   ____________________________________________   Event Date/Time   First MD Initiated Contact with Patient 12/24/20 [phone]     (approximate)  I have reviewed the triage vital signs and the nursing notes.   HISTORY  Chief Complaint Loss of Consciousness and Headache    HPI Sharon Walton is a 50 y.o. female who presents to the ED from home with a chief complaint of headache, syncope and depression.  Patient has a history of depression who has run out of her Prozac.  Reports a 1 day history of right-sided headache not associated with photophobia, nausea/vomiting.  Reports syncopal episode 2 days ago.  States she had pulled into a friend's yard and next thing she knows the cops were waking her up.  Admits to methamphetamine use.  Vague suicidal thoughts without plan.  Denies HI/AH/VH.  Patient is also homeless.  Requesting behavioral medicine evaluation.      Past Medical History:  Diagnosis Date  . Attention deficit disorder (ADD)   . Depression   . Hypertension   . Panic attacks     Patient Active Problem List   Diagnosis Date Noted  . No-show for appointment 06/04/2020  . History of substance use 05/15/2020  . Encounter to establish care 01/25/2020  . Chronic low back pain 01/25/2020  . Chronic pain of right knee 01/25/2020  . Depressive disorder 11/13/2019    Past Surgical History:  Procedure Laterality Date  . ABDOMINAL HYSTERECTOMY    . CHOLECYSTECTOMY      Prior to Admission medications   Medication Sig Start Date End Date Taking? Authorizing Provider  Cyanocobalamin (B-12 PO) Take by mouth daily.    [provider]  FLUoxetine (PROZAC) 20 MG capsule Take 20 mg by mouth daily. Take 1 capsule every morning Patient not taking: Reported on 03/13/2020    [provider]  FLUoxetine (PROZAC) 20 MG tablet TAKE ONE TABLET BY MOUTH EVERY MORNING 09/09/20 12/03/20  Floria Raveling,  MD  FLUoxetine (PROZAC) 20 MG tablet TAKE ONE TABLET BY MOUTH EVERY MORNING 08/16/20 12/03/20  Floria Raveling, MD  FLUoxetine (PROZAC) 40 MG capsule TAKE ONE CAPSULE BY MOUTH EVERY MORNING 09/12/20 09/12/21  Floria Raveling, MD  gabapentin (NEURONTIN) 100 MG capsule TAKE ONE CAPSULE BY MOUTH 3 TIMES A DAY 10/03/20 10/03/21  Iloabachie, Chioma E, NP  hydrOXYzine (ATARAX/VISTARIL) 25 MG tablet TAKE 1 OR 2 TALETS BY MOUTH AT BEDTIME AS NEEDED 09/09/20 09/09/21  Floria Raveling, MD  hydrOXYzine (ATARAX/VISTARIL) 25 MG tablet TAKE 1 OR 2 TABLETS BY MOUTH AT BEDTIME AS NEEDED 06/03/20 06/03/21  Floria Raveling, MD  MELATONIN PO Take by mouth at bedtime.    [provider]  traZODone (DESYREL) 50 MG tablet TAKE ONE TABLET BY MOUTH AT BEDTIME AS NEEDED 09/12/20 09/12/21  Floria Raveling, MD    Allergies Morphine  Family History  Problem Relation Age of Onset  . Diabetes Mother   . Hypertension Mother   . Cancer Father   . Lupus Sister   . Aneurysm Sister   . SIDS Sister     Social History Social History   Tobacco Use  . Smoking status: Every Day    Pack years: 0.00    Types: Cigarettes  . Smokeless tobacco: Never  . Tobacco comments:    down to 2 cigarettes / day   Vaping Use  . Vaping Use: Some days  . Substances: Nicotine, Flavoring  Substance Use Topics  . Alcohol use:  No  . Drug use: No    Review of Systems  Constitutional: No fever/chills Eyes: No visual changes. ENT: No sore throat. Cardiovascular: Denies chest pain. Respiratory: Denies shortness of breath. Gastrointestinal: No abdominal pain.  No nausea, no vomiting.  No diarrhea.  No constipation. Genitourinary: Negative for dysuria. Musculoskeletal: Negative for back pain. Skin: Negative for rash. Neurological: Positive for headache. Negative for focal weakness or numbness. Psychiatric: Positive for depression with suicidal thoughts without plan.  ____________________________________________   PHYSICAL EXAM:  VITAL  SIGNS: ED Triage Vitals  Enc Vitals Group     BP 12/23/20 2256 (!) 169/77     Pulse Rate 12/23/20 2256 (!) 105     Resp 12/23/20 2256 20     Temp 12/23/20 2256 98.5 F (36.9 C)     Temp Source 12/23/20 2256 Oral     SpO2 12/23/20 2256 98 %     Weight 12/23/20 2257 165 lb (74.8 kg)     Height 12/23/20 2257 5\' 4"  (1.626 m)     Head Circumference --      Peak Flow --      Pain Score 12/23/20 2257 7     Pain Loc --      Pain Edu? --      Excl. in [place]? --     Constitutional: Alert and oriented. Well appearing and in no acute distress. Eyes: Conjunctivae are normal. PERRL. EOMI. Head: Atraumatic. Nose: No congestion/rhinnorhea. Mouth/Throat: Mucous membranes are moist.  Edentulous. Neck: No stridor.  Supple neck without meningismus. Cardiovascular: Normal rate, regular rhythm. Grossly normal heart sounds.  Good peripheral circulation. Respiratory: Normal respiratory effort.  No retractions. Lungs CTAB. Gastrointestinal: Soft and nontender. No distention. No abdominal bruits. No CVA tenderness. Musculoskeletal: No lower extremity tenderness nor edema.  No joint effusions. Neurologic: Alert and oriented x3.  CN II to XII grossly intact.  Normal speech and language. No gross focal neurologic deficits are appreciated. No gait instability. Skin:  Skin is warm, dry and intact. No rash noted.  No petechiae. Psychiatric: Mood and affect are tearful. Speech and behavior are normal.  ____________________________________________   LABS (all labs ordered are listed, but only abnormal results are displayed)  Labs Reviewed  CBC  COMPREHENSIVE METABOLIC PANEL  URINALYSIS, COMPLETE (UACMP) WITH MICROSCOPIC  URINE DRUG SCREEN, QUALITATIVE ([facility] ONLY)  TROPONIN I (HIGH SENSITIVITY)  TROPONIN I (HIGH SENSITIVITY)   ____________________________________________  EKG  ED ECG REPORT I, Channel Papandrea J, the attending physician, personally viewed and interpreted this ECG.   Date: 12/24/2020  EKG  Time: 2258  Rate: 109  Rhythm: sinus tachycardia  Axis: Normal  Intervals:none  ST&T Change: Nonspecific  ____________________________________________  RADIOLOGY I, Lacoya Wilbanks J, personally viewed and evaluated these images (plain radiographs) as part of my medical decision making, as well as reviewing the written report by the radiologist.  ED MD interpretation: Unremarkable CT head  Official radiology report(s): CT Head Wo Contrast  Result Date: 12/23/2020 CLINICAL DATA:  50 year old female with headache. EXAM: CT HEAD WITHOUT CONTRAST TECHNIQUE: Contiguous axial images were obtained from the base of the skull through the vertex without intravenous contrast. COMPARISON:  None. FINDINGS: Brain: The ventricles and sulci appropriate size for patient's age. The gray-white matter discrimination is preserved. There is no acute intracranial hemorrhage. No mass effect or midline shift no extra-axial fluid collection. Vascular: No hyperdense vessel or unexpected calcification. Skull: Normal. Negative for fracture or focal lesion. Sinuses/Orbits: No acute finding. Other: None IMPRESSION: Unremarkable noncontrast CT of the  brain. Electronically Signed   By: Anner Crete M.D.   On: 12/23/2020 23:54    ____________________________________________   PROCEDURES  Procedure(s) performed (including Critical Care):  Procedures   ____________________________________________   INITIAL IMPRESSION / ASSESSMENT AND PLAN / ED COURSE  As part of my medical decision making, I reviewed the following data within the [facility] notes reviewed and incorporated, EKG interpreted, Radiograph reviewed, and Notes from prior ED visits     50 year old female presenting with headache, syncope and depression Differential diagnosis includes, but is not limited to, intracranial hemorrhage, meningitis/encephalitis, previous head trauma, cavernous venous thrombosis, tension headache, temporal  arteritis, migraine or migraine equivalent, idiopathic intracranial hypertension, and non-specific headache.   Patient refuses blood work.  Discussed with patient reasoning for her lab work especially given her syncopal episode but patient refuses.  CT head and EKG unremarkable.  Will administer Ativan and consult psychiatry to evaluate.  Patient contracts for safety in the emergency department.  The patient has been placed in psychiatric observation due to the need to provide a safe environment for the patient while obtaining psychiatric consultation and evaluation, as well as ongoing medical and medication management to treat the patient's condition.  The patient has not been placed under full IVC at this time.       ____________________________________________   FINAL CLINICAL IMPRESSION(S) / ED DIAGNOSES  Final diagnoses:  Moderate episode of recurrent major depressive disorder (HCC)  Acute nonintractable headache, unspecified headache type  Homeless  Syncope, unspecified syncope type     ED Discharge Orders     None        Note:  This document was prepared using Dragon voice recognition software and may include unintentional dictation errors.    Paulette Blanch, MD 12/24/20 [phone]

## 2020-12-24 NOTE — ED Notes (Signed)
Pt provided with snack of choice and drink

## 2020-12-24 NOTE — ED Notes (Addendum)
Sharon Walton, SW referred the pt to call [facility] at [phone] Ext [phone] and speak with [name] for a telephone assessment. Pt given the number to call but she refused the phone stating "I am not going there. I lived there for 5 yrs". SW notified of pt refusal.

## 2020-12-24 NOTE — ED Notes (Signed)
Hourly rounding performed, patient currently awake in room. Patient has no complaints at this time. Q15 minute rounds and monitoring via Security Cameras to continue. 

## 2020-12-24 NOTE — ED Notes (Signed)
Pt agrees to change out and have COVID test performed by Probation officer. All pts belongings removed and pt into burgundy scrubs. Belongings placed into bag and labeled with pts information. Pt moved over to [facility]. Report provided to [facility], Therapist, sports. Belongings include: 2 white socks 2 black shoes 1 purple brief 1 black pant 1 gray shirt 1 brown bra

## 2020-12-24 NOTE — Telephone Encounter (Signed)
Called the patient to schedule a 60 minute initial IBH visit. No answer; left message with contact information.

## 2020-12-24 NOTE — ED Notes (Signed)
Report received from Earlie Server, Conservation officer, nature. Patient alert and oriented, warm and dry, in no acute distress. Patient denies SI, HI, AVH and pain, reports depression. Patient made aware of Q15 minute rounds and security cameras for their safety. Patient instructed to come to this nurse with needs or concerns.

## 2020-12-24 NOTE — ED Notes (Signed)
Pt denies SI/HI/AVH on assessment. Pt reports she came in because she is depressed and needs help with getting back on medications.

## 2020-12-25 NOTE — ED Notes (Signed)
Pt asleep at this time, unable to collect vitals. Will collect pt vitals once awake. 

## 2020-12-25 NOTE — ED Notes (Signed)
Hourly rounding performed, patient currently asleep in room. Patient has no complaints at this time. Q15 minute rounds and monitoring via [employer] to continue.

## 2020-12-25 NOTE — ED Notes (Signed)
Pt discharging, given resources for shelter. Pt tearful saying she is homeless and the reason she would not go to [facility] is because she could not take her car with her. Pt advised to call one of the other places the SW gave her but she got frustrated and stated "i'm homeless and you guys are not helping me". Discharge teaching done and pt refused to sign discharge paperwork. Pt then told us to get out of her room so she could dress. Escorted to lobby, ambulatory and in NAD.

## 2020-12-25 NOTE — TOC Transition Note (Signed)
Transition of Care [facility]) - CM/SW Discharge Note   Patient Details  Name: Sharon Walton MRN: [id number] Date of Birth: 1970/12/07  [facility]) CM/SW Contact:  Ova Freshwater Phone Number: [phone] 12/25/2020, 10:27 AM   Clinical Narrative:     CSW spoke with patient who stated she can't go to [facility] because they will not allow her to keep her car, which is parke din the hospital parking lot.  CSW stated I would be able to send her [facility] in [place] and there she can request if there is a bed available. Patient stated she would "just go home or to my car."  CSW stated there are not other long term homeless shelters with bed availability ans she would have to contact the shelters directly to find out when they have availability.  CSW gave patient a bundle of resources including a list of long term homeless shelters in the area which CSW encouraged the patient to contact for possible placement. Patient will discharge to ED lobby and take her vehicle.  CSW updated EDP/ED Staff.        Patient Goals and CMS Choice        Discharge Placement                       Discharge Plan and Services                                     Social Determinants of Health (SDOH) Interventions     Readmission Risk Interventions No flowsheet data found.

## 2020-12-25 NOTE — TOC Initial Note (Signed)
Transition of Care [facility]) - Initial/Assessment Note    Patient Details  Name: Sharon Walton MRN: [id number] Date of Birth: [birth year]  [facility] CM/SW Contact:    Adelene Amas, [place] Phone Number: 12/25/2020, 9:43 AM  Clinical Narrative:                  Patient presents due to syncopal episode x 2 several days ago and running out of Medication.  Patient stated she felt she needed psych evaluation.  Patient is currently homeless.  CSW contacted [facility] who stated they needed to speak with the patient for placement. CSW gave BHU RN contact information for the patient to call [facility] and patient refused stating "I'm not going back there.  I lived there for 5 years." BHU RN updated CSW on patient's comment and refusal to go to [facility].        Patient Goals and CMS Choice        Expected Discharge Plan and Services                                                Prior Living Arrangements/Services                       Activities of Daily Living      Permission Sought/Granted                  Emotional Assessment              Admission diagnosis:  HA Patient Active Problem List   Diagnosis Date Noted   Methamphetamine abuse ([place]) 12/24/2020   Homelessness 12/24/2020   No-show for appointment 06/04/2020   History of substance use 05/15/2020   Encounter to establish care 01/25/2020   Chronic low back pain 01/25/2020   Chronic pain of right knee 01/25/2020   Depressive disorder 11/13/2019   PCP:  Langston Reusing, NP Pharmacy:   [facility] [place] [facility] [address] [place] [address] Phone: [phone] Fax: [phone]  [facility] [address], [place] [address] Phone: [phone] Fax: [phone]     Social Determinants of Health (SDOH) Interventions     Readmission Risk Interventions No flowsheet data found.

## 2020-12-25 NOTE — ED Provider Notes (Signed)
Emergency Medicine Observation Re-evaluation Note  Sharon Walton is a 50 y.o. female, seen on rounds today.  Pt initially presented to the ED for complaints of Loss of Consciousness and Headache Currently, the patient is laying in bed, denies any complaints.  Physical Exam  BP 120/64   Pulse 81   Temp 98.2 F (36.8 C) (Oral)   Resp 17   Ht 5\' 4"  (1.626 m)   Wt 74.8 kg   SpO2 97%   BMI 28.32 kg/m  Physical Exam Constitutional: Resting comfortably. Eyes: Conjunctivae are normal. Head: Atraumatic. Nose: No congestion/rhinnorhea. Mouth/Throat: Mucous membranes are moist. Neck: Normal ROM Cardiovascular: No cyanosis noted. Respiratory: Normal respiratory effort. Gastrointestinal: Non-distended. Genitourinary: deferred Musculoskeletal: No lower extremity tenderness nor edema. Neurologic:  Normal speech and language. No gross focal neurologic deficits are appreciated. Skin:  Skin is warm, dry and intact. No rash noted.   ED Course / MDM  EKG:EKG Interpretation  Date/Time:  Monday December 23 2020 22:58:26 EDT Ventricular Rate:  109 PR Interval:  180 QRS Duration: 80 QT Interval:  348 QTC Calculation: 468 R Axis:   60 Text Interpretation: Sinus tachycardia Cannot rule out Anterior infarct , age undetermined Abnormal ECG Confirmed by UNCONFIRMED, DOCTOR ([id number]), editor Mel Almond, Tammy [id number]) on 12/24/2020 11:42:50 AM  I have reviewed the labs performed to date as well as medications administered while in observation.  Recent changes in the last 24 hours include patient refused placement at [facility] as she can't bring her car.  She has been cleared by psychiatry.  Plan  Current plan is for discharge with resources for local shelters. Patient is not under full IVC at this time.   Blake Divine, MD 12/25/20 [phone]

## 2020-12-25 NOTE — ED Notes (Signed)
VS assessed. Breakfast tray given.

## 2020-12-31 ENCOUNTER — Telehealth: Payer: Self-pay | Admitting: Gerontology

## 2020-12-31 NOTE — Telephone Encounter (Signed)
Scheduled appt for 7/12 at 2:15 pm per staff message.  Patient is aware of appt.  - RM

## 2021-01-14 ENCOUNTER — Other Ambulatory Visit: Payer: Self-pay

## 2021-01-14 ENCOUNTER — Encounter: Payer: Self-pay | Admitting: Gerontology

## 2021-01-14 ENCOUNTER — Ambulatory Visit: Payer: Medicaid Other | Admitting: Gerontology

## 2021-01-14 VITALS — BP 118/81 | HR 88 | Temp 97.7°F | Resp 16 | Ht 64.0 in | Wt 174.2 lb

## 2021-01-14 DIAGNOSIS — F32A Depression, unspecified: Secondary | ICD-10-CM

## 2021-01-14 DIAGNOSIS — Z8669 Personal history of other diseases of the nervous system and sense organs: Secondary | ICD-10-CM | POA: Insufficient documentation

## 2021-01-14 DIAGNOSIS — F329 Major depressive disorder, single episode, unspecified: Secondary | ICD-10-CM

## 2021-01-14 DIAGNOSIS — G8929 Other chronic pain: Secondary | ICD-10-CM

## 2021-01-14 MED ORDER — GABAPENTIN 100 MG PO CAPS
ORAL_CAPSULE | Freq: Three times a day (TID) | ORAL | 0 refills | Status: DC
  Filled 2021-01-14: qty 90, fill #0
  Filled 2021-01-16: qty 42, 14d supply, fill #0
  Filled 2021-02-11: qty 48, 16d supply, fill #1

## 2021-01-14 MED ORDER — HYDROXYZINE HCL 25 MG PO TABS
ORAL_TABLET | ORAL | 0 refills | Status: DC
  Filled 2021-01-14: qty 14, fill #0
  Filled 2021-01-16: qty 14, 7d supply, fill #0

## 2021-01-14 NOTE — Progress Notes (Signed)
Established Patient Office Visit  Subjective:  Patient ID: Sharon Walton, female    DOB: 06-26-1971  Age: 50 y.o. MRN: [id number]  CC:  Chief Complaint  Patient presents with   Hospitalization Follow-up    Patient seen at [facility] ED on 12/24/20    HPI Sharon Walton is a 50 y/o female who has history of ADD, Depression, Hypertension, Panic attacks, presents for routine follow up visit after not showing up for many appointments. She is out of her medications for many weeks. She was seen at the ED on 12/25/20 for c/o headache and loss of consciousness, but currently denies any of the symptoms. She c/o chronic non traumatic constant lower back pain that has been going on for many years. She states that pain is constant dull to sharp 4/10 pain. She states that it feels like a knife stab to her back. She denies saddle anesthesia, bladder/bowel incontnence. She had Lumbar x ray done on 11/13/19 and it showed Degenerative disc and facet disease as above, No acute bony Abnormality, No change and she missed an appointment with [facility] Orthopedic Surgeon Dr Vickki Hearing. She also c/o peripheral neuropathy to her fingers and lower legs that has been going on since February of 2022, and was out of gabapentin. She states that her mood is dysphoric, denies suicidal nor homicidal ideation and was discharged from [place] because she was homeless and was out of the [place]. Overall, she states that she's doing well and offers no further complaint.  Past Medical History:  Diagnosis Date   Attention deficit disorder (ADD)    Depression    Hypertension    Panic attacks     Past Surgical History:  Procedure Laterality Date   ABDOMINAL HYSTERECTOMY     CHOLECYSTECTOMY      Family History  Problem Relation Age of Onset   Diabetes Mother    Hypertension Mother    Cancer Father    Lupus Sister    Aneurysm Sister    SIDS Sister     Social History   Socioeconomic History   Marital status: Divorced     Spouse name: Not on file   Number of children: Not on file   Years of education: Not on file   Highest education level: Not on file  Occupational History    Comment: Public librarian [employer]  Tobacco Use   Smoking status: Every Day    Pack years: 0.00    Types: Cigars   Smokeless tobacco: Never   Tobacco comments:    Patient smokes 1 black and mild per day and vapes. Patient is homeless (since 09/2020) and lives in her car.  Vaping Use   Vaping Use: Every day   Substances: Nicotine, Flavoring  Substance and Sexual Activity   Alcohol use: No   Drug use: No   Sexual activity: Yes    Partners: Male    Birth control/protection: Post-menopausal, Surgical    Comment: Hysterectomy  Other Topics Concern   Not on file  Social History Narrative   Not on file   Social Determinants of Health   Financial Resource Strain: Not on file  Food Insecurity: Food Insecurity Present   Worried About [place] in the Last Year: Often true   Arboriculturist in the Last Year: Often true  Transportation Needs: No Transportation Needs   Lack of Transportation (Medical): No   Lack of Transportation (Non-Medical): No  Physical Activity: Not on file  Stress: Not  on file  Social Connections: Not on file  Intimate Partner Violence: Not on file    Outpatient Medications Prior to Visit  Medication Sig Dispense Refill   Cyanocobalamin (B-12 PO) Take 1 tablet by mouth daily.     FLUoxetine (PROZAC) 40 MG capsule TAKE ONE CAPSULE BY MOUTH EVERY MORNING 30 capsule 1   gabapentin (NEURONTIN) 100 MG capsule TAKE ONE CAPSULE BY MOUTH 3 TIMES A DAY 90 capsule 2   hydrOXYzine (ATARAX/VISTARIL) 25 MG tablet TAKE 1 OR 2 TALETS BY MOUTH AT BEDTIME AS NEEDED 14 tablet 0   MELATONIN PO Take 5 mg by mouth at bedtime.     No facility-administered medications prior to visit.    Allergies  Allergen Reactions   Morphine Rash    urticaria    ROS Review of Systems  Constitutional: Negative.   Respiratory:  Negative.    Cardiovascular: Negative.   Musculoskeletal:  Positive for back pain (chronic back pain).  Neurological:  Positive for numbness.     Objective:    Physical Exam HENT:     Head: Normocephalic and atraumatic.  Cardiovascular:     Rate and Rhythm: Normal rate and regular rhythm.     Pulses: Normal pulses.     Heart sounds: Normal heart sounds.  Pulmonary:     Effort: Pulmonary effort is normal.     Breath sounds: Normal breath sounds.  Musculoskeletal:        General: Normal range of motion.  Neurological:     General: No focal deficit present.     Mental Status: She is alert and oriented to person, place, and time. Mental status is at baseline.  Psychiatric:        Mood and Affect: Mood normal.        Behavior: Behavior normal.        Thought Content: Thought content normal.        Judgment: Judgment normal.    BP 118/81 (BP Location: Right Arm, Patient Position: Sitting, Cuff Size: Large)   Pulse 88   Temp 97.7 F (36.5 C)   Resp 16   Ht 5\' 4"  (1.626 m)   Wt 174 lb 3.2 oz (79 kg)   SpO2 98%   BMI 29.90 kg/m  Wt Readings from Last 3 Encounters:  01/14/21 174 lb 3.2 oz (79 kg)  12/23/20 165 lb (74.8 kg)  10/03/20 186 lb (84.4 kg)     Health Maintenance Due  Topic Date Due   COVID-19 Vaccine (1) Never done   Pneumococcal Vaccine 46-59 Years old (1 - PCV) Never done   HIV Screening  Never done   Hepatitis C Screening  Never done   TETANUS/TDAP  Never done   PAP SMEAR-Modifier  Never done   COLONOSCOPY (Pts 45-51yrs Insurance coverage will need to be confirmed)  Never done    There are no preventive care reminders to display for this patient.  Lab Results  Component Value Date   TSH 3.38 09/28/2011   Lab Results  Component Value Date   WBC 10.7 (H) 11/13/2019   HGB 11.9 (L) 11/13/2019   HCT 36.0 11/13/2019   MCV 80.9 11/13/2019   PLT 336 11/13/2019   Lab Results  Component Value Date   NA 139 11/13/2019   K 3.2 (L) 11/13/2019   CO2 25  11/13/2019   GLUCOSE 103 (H) 11/13/2019   BUN 9 11/13/2019   CREATININE 0.78 11/13/2019   BILITOT 0.4 11/13/2019   ALKPHOS 98 11/13/2019  AST 20 11/13/2019   ALT 14 11/13/2019   PROT 7.8 11/13/2019   ALBUMIN 4.1 11/13/2019   CALCIUM 8.9 11/13/2019   ANIONGAP 8 11/13/2019   No results found for: CHOL No results found for: HDL No results found for: LDLCALC No results found for: TRIG No results found for: CHOLHDL No results found for: HGBA1C    Assessment & Plan:    1. Chronic low back pain, unspecified back pain laterality, unspecified whether sciatica present -She was advised to take Tylenol 500 mg every 8 hours as need, and follow up with [facility] Orthopedic Dr Vickki Hearing. -She was also advised to go to the ED with worsening symptoms.  2. Depressive disorder - She will continue on Hydroxyzine, and follow up with [facility] Behavioral team. She was advised to call the Crisis help line with worsening symptoms. - hydrOXYzine (ATARAX/VISTARIL) 25 MG tablet; TAKE 1 OR 2 TALETS BY MOUTH AT BEDTIME AS NEEDED  Dispense: 14 tablet; Refill: 0  3. History of peripheral neuropathy -She will continue on gabapentin , and follow up with [facility] Orthopedic Dr Vickki Hearing. -She was also advised to go to the ED with worsening symptoms. - gabapentin (NEURONTIN) 100 MG capsule; TAKE ONE CAPSULE BY MOUTH 3 TIMES A DAY  Dispense: 90 capsule; Refill: 0     Follow-up: Return in about 3 weeks (around 02/04/2021).    Jaquavious Mercer Jerold Coombe, NP

## 2021-01-16 ENCOUNTER — Other Ambulatory Visit: Payer: Self-pay

## 2021-01-23 ENCOUNTER — Other Ambulatory Visit: Payer: Self-pay

## 2021-01-23 ENCOUNTER — Telehealth: Payer: Self-pay

## 2021-01-23 NOTE — Telephone Encounter (Signed)
Social worker called patient and rescheduled her appointment with our [place].

## 2021-01-28 ENCOUNTER — Ambulatory Visit: Payer: Medicaid Other | Admitting: Specialist

## 2021-01-29 ENCOUNTER — Other Ambulatory Visit: Payer: Medicaid Other

## 2021-01-29 ENCOUNTER — Institutional Professional Consult (permissible substitution): Payer: Medicaid Other | Admitting: Licensed Clinical Social Worker

## 2021-02-04 ENCOUNTER — Institutional Professional Consult (permissible substitution): Payer: Medicaid Other | Admitting: Licensed Clinical Social Worker

## 2021-02-04 ENCOUNTER — Telehealth: Payer: Self-pay

## 2021-02-04 ENCOUNTER — Ambulatory Visit: Payer: Medicaid Other | Admitting: Gerontology

## 2021-02-04 ENCOUNTER — Ambulatory Visit: Payer: Self-pay | Admitting: Licensed Clinical Social Worker

## 2021-02-04 ENCOUNTER — Ambulatory Visit: Payer: Medicaid Other

## 2021-02-04 ENCOUNTER — Other Ambulatory Visit: Payer: Self-pay

## 2021-02-04 DIAGNOSIS — F1910 Other psychoactive substance abuse, uncomplicated: Secondary | ICD-10-CM

## 2021-02-04 DIAGNOSIS — F339 Major depressive disorder, recurrent, unspecified: Secondary | ICD-10-CM

## 2021-02-04 NOTE — Telephone Encounter (Signed)
Social worker called patient for her 3 pm appointment and left a voicemail message with clinic's phone number.   Social worker called patient back at 3:08 pm for her 3 pm appointment and left another voicemail with the clinic's phone number.

## 2021-02-04 NOTE — Telephone Encounter (Signed)
Social worker called patient twice to change her appointment to a phone visit and to inform her that in compliance with [place], the [place] would need for her to be in a quiet and private space and not driving.  Social worker called patient again, per LCSWA's referral for resources and set up an appointment with her to identify needed resources.
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
## 2021-02-05 ENCOUNTER — Other Ambulatory Visit: Payer: Medicaid Other

## 2021-02-11 ENCOUNTER — Other Ambulatory Visit: Payer: Self-pay

## 2021-02-11 ENCOUNTER — Other Ambulatory Visit: Payer: Self-pay | Admitting: Gerontology

## 2021-02-11 ENCOUNTER — Telehealth: Payer: Self-pay | Admitting: Pharmacy Technician

## 2021-02-11 DIAGNOSIS — F329 Major depressive disorder, single episode, unspecified: Secondary | ICD-10-CM

## 2021-02-11 DIAGNOSIS — F32A Depression, unspecified: Secondary | ICD-10-CM

## 2021-02-11 DIAGNOSIS — Z8669 Personal history of other diseases of the nervous system and sense organs: Secondary | ICD-10-CM

## 2021-02-11 NOTE — Telephone Encounter (Signed)
Patient stated that she started a job a week ago.  Will not have a month's worth of paystubs until the end of August 2022.  [facility] will provide medication assistance until the end of August 2022.  Patient acknowledged that she understood if paystubs are not provided then no additional medication will be provided by [facility] past the end of August 2022.  [facility]

## 2021-02-12 ENCOUNTER — Ambulatory Visit: Payer: Medicaid Other | Admitting: Gerontology

## 2021-02-12 ENCOUNTER — Other Ambulatory Visit: Payer: Self-pay | Admitting: Gerontology

## 2021-02-12 ENCOUNTER — Other Ambulatory Visit: Payer: Self-pay

## 2021-02-12 ENCOUNTER — Other Ambulatory Visit: Payer: Medicaid Other

## 2021-02-12 DIAGNOSIS — Z8669 Personal history of other diseases of the nervous system and sense organs: Secondary | ICD-10-CM

## 2021-02-12 DIAGNOSIS — F32A Depression, unspecified: Secondary | ICD-10-CM

## 2021-02-12 DIAGNOSIS — F329 Major depressive disorder, single episode, unspecified: Secondary | ICD-10-CM

## 2021-02-13 ENCOUNTER — Other Ambulatory Visit: Payer: Self-pay

## 2021-02-13 ENCOUNTER — Ambulatory Visit: Payer: Self-pay | Admitting: Licensed Clinical Social Worker

## 2021-02-13 ENCOUNTER — Other Ambulatory Visit: Payer: Self-pay | Admitting: Gerontology

## 2021-02-13 ENCOUNTER — Ambulatory Visit: Payer: Medicaid Other | Admitting: Licensed Clinical Social Worker

## 2021-02-13 DIAGNOSIS — F3112 Bipolar disorder, current episode manic without psychotic features, moderate: Secondary | ICD-10-CM

## 2021-02-13 DIAGNOSIS — F410 Panic disorder [episodic paroxysmal anxiety] without agoraphobia: Secondary | ICD-10-CM

## 2021-02-13 DIAGNOSIS — F1910 Other psychoactive substance abuse, uncomplicated: Secondary | ICD-10-CM

## 2021-02-13 DIAGNOSIS — Z8669 Personal history of other diseases of the nervous system and sense organs: Secondary | ICD-10-CM

## 2021-02-13 MED ORDER — GABAPENTIN 100 MG PO CAPS
100.0000 mg | ORAL_CAPSULE | Freq: Three times a day (TID) | ORAL | 0 refills | Status: DC
  Filled 2021-02-13: qty 90, 30d supply, fill #0
  Filled 2021-02-24: qty 42, 14d supply, fill #0
  Filled 2021-03-11: qty 48, 16d supply, fill #1

## 2021-02-13 NOTE — BH Specialist Note (Signed)
Integrated Behavioral Health Follow Up In-Person Visit  MRN: [id number] Name: Sharon Walton   Total time: 30 minutes  Types of Service: Telephone visit   Interpretor:No. Interpretor Name and Language: N/A  Subjective: Sharon Walton is a 50 y.o. female accompanied by  herself Patient was referred by Carlyon Shadow, NP  for Mental Health. Patient reports the following symptoms/concerns: The patient explained that she was experiencing mania since her last appointment. She reported that she slept 6 hours last night but before that she had been awake for six days. She explained that she had a panic attack in [place] yesterday five minutes after she walked into the store; she described her symptoms as profuse sweating, heart racing, trouble breathing, chest pain, and feeling of impending doom. She noted that it passed after about 15 minutes. She explained that she is trying to find a room so that she is not sleeping in her car. She reported that the shelter will not let her stay there because of her mental health. She reports that one room she applied for she was turned down because there were children in the home and they said she would not be a good fit and that made her feel bad.  She discussed financial stressors impacting her life. The patient noted that she understood that the [facility] could not prescribe certain types of psychotropic medications and that she was referred to [facility] for a higher level of care and should follow-up  or mental healthcare. However, the patient stated, " I am not going back to [facility] because I saw Dr. Jamse Arn for a year and she would not increase my medication even though I told her it was not working." The patient asked if Ellender Hose might take her. She stated she knew how to get help if she had a crisis. The patient denied any suicidal or homicidal thoughts.   Duration of problem: Years; Severity of problem: severe  Objective: Mood: Euphoric and Affect:  Labile Risk of harm to self or others: No plan to harm self or others  Life Context: Family and Social: The patient has limited social connections and no contact with her family. School/Work: The patient is currently working part time as a Scientist, water quality. Self-Care: The patient explained that she is not practicing self care. Life Changes: The patient is looking for a room to rent.  Patient and/or Family's Strengths/Protective Factors: Sense of purpose  Goals Addressed: Patient will:  Reduce symptoms of: agitation, anxiety, insomnia, mood instability, and stress   Increase knowledge and/or ability of: coping skills, healthy habits, self-management skills, and stress reduction   Demonstrate ability to: Increase adequate support systems for patient/family, Increase motivation to adhere to plan of care, and Decrease self-medicating behaviors  Progress towards Goals: Ongoing  Interventions: Interventions utilized:  Supportive Counselingwas utilized by the clinician during today's follow up session. The clinician processed with the patient how they have been doing since the last follow-up session. The clinician provided a space for the patient to ventilate their frustrations regarding their current life circumstances. Clinician measured the patient's anxiety and depression on a numerical scale. The clinician encouraged the patient to utilize their coping skills to deal with their current life circumstances. Clinician explained to the patient the case consultation recommendations were for her to follow-up with [facility] for psychotropic medication management and mental health care. Clinician explained to the patient that she could call 911, go to the nearest emergency department, call or text [phone], or utilize the [facility]  mobile crisis line or walk in crisis services should she experience a mental health or substance use crisis. Clinician determined that the patient is competent, thinking clearly, orientated x 3, able  to make own medical decisions, and is not acutely suicidal but if she were, she would access emergency crisis services.  Standardized Assessments completed: GAD-7 and PHQ 9 GAD-7             19 PHQ-9             20    Assessment: Patient currently experiencing see above.   Patient may benefit from see above.  Plan: Follow up with behavioral health clinician on : 02/27/2021 at 2:30 PM  Behavioral recommendations:  Referral(s): [place] (In Clinic) "From scale of 1-10, how likely are you to follow plan?":   Lesli Albee, LCSWA

## 2021-02-14 ENCOUNTER — Other Ambulatory Visit: Payer: Self-pay

## 2021-02-19 ENCOUNTER — Other Ambulatory Visit: Payer: Self-pay

## 2021-02-19 ENCOUNTER — Other Ambulatory Visit: Payer: Medicaid Other

## 2021-02-20 ENCOUNTER — Telehealth: Payer: Self-pay | Admitting: Licensed Clinical Social Worker

## 2021-02-20 NOTE — Telephone Encounter (Signed)
Returned the patient's call, no answer left voice mail explaining that I will be out of the office until next Tuesday 02/25/2021 and I will call again then.

## 2021-02-24 ENCOUNTER — Other Ambulatory Visit: Payer: Self-pay

## 2021-02-24 MED FILL — Fluoxetine HCl Cap 40 MG: ORAL | 30 days supply | Qty: 30 | Fill #0 | Status: CN

## 2021-02-25 ENCOUNTER — Other Ambulatory Visit: Payer: Self-pay

## 2021-02-27 ENCOUNTER — Encounter: Payer: Medicaid Other | Admitting: Licensed Clinical Social Worker

## 2021-02-27 ENCOUNTER — Other Ambulatory Visit: Payer: Self-pay

## 2021-02-27 NOTE — Progress Notes (Signed)
This encounter was created in error - please disregard.

## 2021-03-04 ENCOUNTER — Other Ambulatory Visit: Payer: Self-pay

## 2021-03-06 ENCOUNTER — Other Ambulatory Visit: Payer: Self-pay

## 2021-03-06 ENCOUNTER — Ambulatory Visit: Payer: Medicaid Other | Admitting: Gerontology

## 2021-03-06 NOTE — Telephone Encounter (Signed)
Attempted to return the patient's call; no answer; unable to leave a voicemail.

## 2021-03-11 ENCOUNTER — Ambulatory Visit: Payer: Medicaid Other | Admitting: Specialist

## 2021-03-11 ENCOUNTER — Other Ambulatory Visit: Payer: Self-pay

## 2021-03-12 ENCOUNTER — Ambulatory Visit: Payer: Medicaid Other | Admitting: Gerontology

## 2021-03-18 ENCOUNTER — Ambulatory Visit: Payer: Medicaid Other | Admitting: Gerontology

## 2021-04-03 ENCOUNTER — Other Ambulatory Visit: Payer: Self-pay

## 2021-04-03 ENCOUNTER — Ambulatory Visit: Payer: Medicaid Other | Admitting: Gerontology

## 2021-04-03 ENCOUNTER — Encounter: Payer: Self-pay | Admitting: Gerontology

## 2021-04-03 VITALS — BP 128/83 | HR 86 | Temp 98.0°F | Resp 18 | Ht 64.0 in | Wt 182.6 lb

## 2021-04-03 DIAGNOSIS — M62830 Muscle spasm of back: Secondary | ICD-10-CM | POA: Insufficient documentation

## 2021-04-03 DIAGNOSIS — D229 Melanocytic nevi, unspecified: Secondary | ICD-10-CM

## 2021-04-03 DIAGNOSIS — Z0000 Encounter for general adult medical examination without abnormal findings: Secondary | ICD-10-CM

## 2021-04-03 DIAGNOSIS — M545 Low back pain, unspecified: Secondary | ICD-10-CM

## 2021-04-03 DIAGNOSIS — E669 Obesity, unspecified: Secondary | ICD-10-CM

## 2021-04-03 DIAGNOSIS — F339 Major depressive disorder, recurrent, unspecified: Secondary | ICD-10-CM

## 2021-04-03 DIAGNOSIS — Z8669 Personal history of other diseases of the nervous system and sense organs: Secondary | ICD-10-CM

## 2021-04-03 DIAGNOSIS — G8929 Other chronic pain: Secondary | ICD-10-CM

## 2021-04-03 MED ORDER — CYCLOBENZAPRINE HCL 10 MG PO TABS
10.0000 mg | ORAL_TABLET | Freq: Every day | ORAL | 0 refills | Status: DC
  Filled 2021-04-03: qty 30, 30d supply, fill #0

## 2021-04-03 MED ORDER — GABAPENTIN 300 MG PO CAPS
300.0000 mg | ORAL_CAPSULE | Freq: Three times a day (TID) | ORAL | 0 refills | Status: DC
  Filled 2021-04-03: qty 90, 30d supply, fill #0

## 2021-04-03 MED ORDER — GABAPENTIN 300 MG PO CAPS: 300.0000 mg | ORAL_CAPSULE | Freq: Three times a day (TID) | ORAL | 0 refills | Status: DC

## 2021-04-03 MED ORDER — CYCLOBENZAPRINE HCL 10 MG PO TABS: 10.0000 mg | ORAL_TABLET | Freq: Every day | ORAL | 0 refills | Status: DC

## 2021-04-03 MED ORDER — GABAPENTIN 100 MG PO CAPS
100.0000 mg | ORAL_CAPSULE | Freq: Three times a day (TID) | ORAL | 0 refills | Status: DC
  Filled 2021-04-03: qty 90, 30d supply, fill #0

## 2021-04-03 NOTE — Progress Notes (Signed)
Established Patient Office Visit  Subjective:  Patient ID: Sharon Walton, female    DOB: 1971-01-12  Age: 50 y.o. MRN: [id number]  CC:  Chief Complaint  Patient presents with   Follow-up    Patient complains "Moles" in the genital area. Last about 4 months   Leg Pain    Patient complains Bilateral lower extremities pain x 7 month (getting worse). Patient brought 2018 Lumbar spine MRI report.     HPI Sharon Walton is a 50 y/o female who has history of ADD, Depression, Hypertension, Panic attacks, presents for c/o worsening lower back pain. She continues to experience constant sharp pain to lower back. She reports that it feels like someone is stabbing her, it radiates to her left leg. She states that pain intensity increases from 5-9. She had Lumbar spine imaging done  09/08/19 and it showed Degenerative disc and facet disease as above. No acute bony abnormality.She reports that she's unable to stand for more than 30 minutes at work.  She was out of her gabapentin and it modertly relieves her symptoms. She also c/o intermittent muscle spasms to her back that has been going on for many years.She reports 3 small flesh colored moles to her bilateral inguinal area that are tender to touch. She states that she will follow up at [facility] for her mental health care. She states that her mood is dysphoric, but denies suicidal nor homicidal ideation. Overall, she states that she's doing well and offers no further complaint.   Past Medical History:  Diagnosis Date   Attention deficit disorder (ADD)    Depression    Hypertension    Panic attacks     Past Surgical History:  Procedure Laterality Date   ABDOMINAL HYSTERECTOMY     CHOLECYSTECTOMY      Family History  Problem Relation Age of Onset   Diabetes Mother    Hypertension Mother    Cancer Father    Lupus Sister    Aneurysm Sister    SIDS Sister     Social History   Socioeconomic History   Marital status: Divorced     Spouse name: Not on file   Number of children: Not on file   Years of education: Not on file   Highest education level: Not on file  Occupational History    Comment: [employer]  Tobacco Use   Smoking status: Every Day    Packs/day: 0.25    Years: 25.00    Pack years: 6.25    Types: Cigarettes   Smokeless tobacco: Never   Tobacco comments:    Patient smokes 3 cigarettes  per day and vapes. Patient is homeless (since 09/2020) and lives in her car.  Vaping Use   Vaping Use: Every day   Substances: Nicotine, CBD, Flavoring  Substance and Sexual Activity   Alcohol use: No   Drug use: No   Sexual activity: Yes    Partners: Male    Birth control/protection: Post-menopausal, Surgical    Comment: Hysterectomy  Other Topics Concern   Not on file  Social History Narrative   Not on file   Social Determinants of Health   Financial Resource Strain: Not on file  Food Insecurity: Food Insecurity Present   Worried About [place] in the Last Year: Often true   Ran Out of Food in the Last Year: Often true  Transportation Needs: No Transportation Needs   Lack of Transportation (Medical): No   Lack of  Transportation (Non-Medical): No  Physical Activity: Not on file  Stress: Not on file  Social Connections: Not on file  Intimate Partner Violence: Not on file    Outpatient Medications Prior to Visit  Medication Sig Dispense Refill   gabapentin (NEURONTIN) 100 MG capsule Take 1 capsule (100 mg total) by mouth 3 (three) times daily. 90 capsule 0   FLUoxetine (PROZAC) 40 MG capsule TAKE ONE CAPSULE BY MOUTH EVERY MORNING (Patient not taking: Reported on 04/03/2021) 30 capsule 1   hydrOXYzine (ATARAX/VISTARIL) 25 MG tablet TAKE 1 OR 2 TABLETS BY MOUTH AT BEDTIME AS NEEDED (Patient not taking: Reported on 04/03/2021) 14 tablet 0   Cyanocobalamin (B-12 PO) Take 1 tablet by mouth daily. (Patient not taking: Reported on 04/03/2021)     No facility-administered medications prior  to visit.    Allergies  Allergen Reactions   Morphine Rash    urticaria    ROS Review of Systems  Constitutional: Negative.   Respiratory: Negative.    Cardiovascular: Negative.   Musculoskeletal:  Positive for back pain.  Skin:        3 flesh colored moles to bilateral inguinal area.  Neurological: Negative.   Psychiatric/Behavioral: Negative.       Objective:    Physical Exam HENT:     Head: Normocephalic and atraumatic.  Cardiovascular:     Rate and Rhythm: Normal rate and regular rhythm.     Pulses: Normal pulses.     Heart sounds: Normal heart sounds.  Pulmonary:     Effort: Pulmonary effort is normal.     Breath sounds: Normal breath sounds.  Skin:      Neurological:     General: No focal deficit present.     Mental Status: She is alert and oriented to person, place, and time. Mental status is at baseline.  Psychiatric:        Mood and Affect: Mood normal.        Behavior: Behavior normal.        Thought Content: Thought content normal.        Judgment: Judgment normal.    BP 128/83 (BP Location: Left Arm, Patient Position: Sitting, Cuff Size: Large)   Pulse 86   Temp 98 F (36.7 C)   Resp 18   Ht 5' 4"  (1.626 m)   Wt 182 lb 9.6 oz (82.8 kg)   SpO2 97%   BMI 31.34 kg/m  Wt Readings from Last 3 Encounters:  04/03/21 182 lb 9.6 oz (82.8 kg)  01/14/21 174 lb 3.2 oz (79 kg)  12/23/20 165 lb (74.8 kg)     Health Maintenance Due  Topic Date Due   COVID-19 Vaccine (1) Never done   HIV Screening  Never done   Hepatitis C Screening  Never done   TETANUS/TDAP  Never done   PAP SMEAR-Modifier  Never done   COLONOSCOPY (Pts 45-14yr Insurance coverage will need to be confirmed)  Never done   INFLUENZA VACCINE  02/03/2021    There are no preventive care reminders to display for this patient.  Lab Results  Component Value Date   TSH 3.38 09/28/2011   Lab Results  Component Value Date   WBC 8.4 04/03/2021   HGB 11.6 04/03/2021   HCT 35.5  04/03/2021   MCV 83 04/03/2021   PLT 318 04/03/2021   Lab Results  Component Value Date   NA 141 04/03/2021   K 4.0 04/03/2021   CO2 23 04/03/2021   GLUCOSE 97 04/03/2021  BUN 10 04/03/2021   CREATININE 0.77 04/03/2021   BILITOT <0.2 04/03/2021   ALKPHOS 102 04/03/2021   AST 13 04/03/2021   ALT 10 04/03/2021   PROT 6.3 04/03/2021   ALBUMIN 4.1 04/03/2021   CALCIUM 8.9 04/03/2021   ANIONGAP 8 11/13/2019   EGFR 95 04/03/2021   Lab Results  Component Value Date   CHOL 172 04/03/2021   Lab Results  Component Value Date   HDL 52 04/03/2021   Lab Results  Component Value Date   LDLCALC 103 (H) 04/03/2021   Lab Results  Component Value Date   TRIG 93 04/03/2021   Lab Results  Component Value Date   CHOLHDL 3.3 04/03/2021   Lab Results  Component Value Date   HGBA1C 5.7 (H) 04/03/2021      Assessment & Plan:   1. History of peripheral neuropathy - She will continue on Gabapentin 300 mg tid, was advised to notify clinic for worsening symptoms. - gabapentin (NEURONTIN) 300 MG capsule; Take 1 capsule (300 mg total) by mouth 3 (three) times daily.  Dispense: 90 capsule; Refill: 0  2. Chronic low back pain, unspecified back pain laterality, unspecified whether sciatica present - She was advised to complete [facility] financial application for - Ambulatory referral to Orthopedic Surgery - Ambulatory referral to Pain Clinic  3. Muscle spasm of back - She will start on Flexeril 10 mg daily, educated on medication side effects and advised to notify clinic with worsening symptoms. - cyclobenzaprine (FLEXERIL) 10 MG tablet; Take 1 tablet (10 mg total) by mouth daily at 6 (six) AM.  Dispense: 30 tablet; Refill: 0  4. Fleshy skin mole - She was advised to complete [facility] financial application for - Ambulatory referral to Dermatology   5. Healthcare maintenance Routine labs will be checked - CBC w/Diff - Comp Met (CMET)  HgB A1c - Lipid Profile    Follow-up: Return in  about 4 weeks (around 05/01/2021), or if symptoms worsen or fail to improve.    Haelie Clapp Jerold Coombe, NP

## 2021-04-04 ENCOUNTER — Other Ambulatory Visit: Payer: Self-pay

## 2021-04-04 LAB — COMPREHENSIVE METABOLIC PANEL
ALT: 10 IU/L (ref 0–32)
AST: 13 IU/L (ref 0–40)
Albumin/Globulin Ratio: 1.9 (ref 1.2–2.2)
Albumin: 4.1 g/dL (ref 3.8–4.8)
Alkaline Phosphatase: 102 IU/L (ref 44–121)
BUN/Creatinine Ratio: 13 (ref 9–23)
BUN: 10 mg/dL (ref 6–24)
Bilirubin Total: <0.2 mg/dL (ref 0.0–1.2)
CO2: 23 mmol/L (ref 20–29)
Calcium: 8.9 mg/dL (ref 8.7–10.2)
Chloride: 105 mmol/L (ref 96–106)
Creatinine, Ser: 0.77 mg/dL (ref 0.57–1.00)
Globulin, Total: 2.2 g/dL (ref 1.5–4.5)
Glucose: 97 mg/dL (ref 70–99)
Potassium: 4 mmol/L (ref 3.5–5.2)
Sodium: 141 mmol/L (ref 134–144)
Total Protein: 6.3 g/dL (ref 6.0–8.5)
eGFR: 95 mL/min/{1.73_m2} (ref >59)

## 2021-04-04 LAB — CBC WITH DIFFERENTIAL/PLATELET
Basophils Absolute: 0 10*3/uL (ref 0.0–0.2)
Basos: 0 %
EOS (ABSOLUTE): 0.1 10*3/uL (ref 0.0–0.4)
Eos: 2 %
Hematocrit: 35.5 % (ref 34.0–46.6)
Hemoglobin: 11.6 g/dL (ref 11.1–15.9)
Immature Grans (Abs): 0 10*3/uL (ref 0.0–0.1)
Immature Granulocytes: 1 %
Lymphocytes Absolute: 3.1 10*3/uL (ref 0.7–3.1)
Lymphs: 37 %
MCH: 27.2 pg (ref 26.6–33.0)
MCHC: 32.7 g/dL (ref 31.5–35.7)
MCV: 83 fL (ref 79–97)
Monocytes Absolute: 0.5 10*3/uL (ref 0.1–0.9)
Monocytes: 6 %
Neutrophils Absolute: 4.6 10*3/uL (ref 1.4–7.0)
Neutrophils: 54 %
Platelets: 318 10*3/uL (ref 150–450)
RBC: 4.26 x10E6/uL (ref 3.77–5.28)
RDW: 12.9 % (ref 11.7–15.4)
WBC: 8.4 10*3/uL (ref 3.4–10.8)

## 2021-04-04 LAB — LIPID PANEL
Chol/HDL Ratio: 3.3 ratio (ref 0.0–4.4)
Cholesterol, Total: 172 mg/dL (ref 100–199)
HDL: 52 mg/dL (ref >39)
LDL Chol Calc (NIH): 103 mg/dL — ABNORMAL HIGH (ref 0–99)
Triglycerides: 93 mg/dL (ref 0–149)
VLDL Cholesterol Cal: 17 mg/dL (ref 5–40)

## 2021-04-04 LAB — HEMOGLOBIN A1C
Est. average glucose Bld gHb Est-mCnc: 117 mg/dL
Hgb A1c MFr Bld: 5.7 % — ABNORMAL HIGH (ref 4.8–5.6)

## 2021-04-22 ENCOUNTER — Telehealth: Payer: Self-pay | Admitting: Emergency Medicine

## 2021-04-22 NOTE — Telephone Encounter (Signed)
Received fax from [facility] re: referral that was sent on patient's behalf. Referral has been denied by pain management. Their physicians do no feel they can be of assistance to this patient's care.

## 2021-04-29 ENCOUNTER — Ambulatory Visit: Payer: Self-pay | Admitting: Orthopaedic Surgery

## 2021-05-01 ENCOUNTER — Encounter: Payer: Self-pay | Admitting: Gerontology

## 2021-05-01 ENCOUNTER — Other Ambulatory Visit: Payer: Self-pay

## 2021-05-01 ENCOUNTER — Ambulatory Visit: Payer: Medicaid Other | Admitting: Gerontology

## 2021-05-01 VITALS — BP 145/83 | HR 96 | Temp 98.2°F | Resp 17 | Ht 63.5 in | Wt 184.9 lb

## 2021-05-01 DIAGNOSIS — F32A Depression, unspecified: Secondary | ICD-10-CM

## 2021-05-01 DIAGNOSIS — I10 Essential (primary) hypertension: Secondary | ICD-10-CM

## 2021-05-01 DIAGNOSIS — M62830 Muscle spasm of back: Secondary | ICD-10-CM

## 2021-05-01 DIAGNOSIS — Z8669 Personal history of other diseases of the nervous system and sense organs: Secondary | ICD-10-CM

## 2021-05-01 MED ORDER — HYDROXYZINE HCL 25 MG PO TABS: ORAL_TABLET | ORAL | 0 refills | Status: DC

## 2021-05-01 MED ORDER — CYCLOBENZAPRINE HCL 10 MG PO TABS: 10.0000 mg | ORAL_TABLET | Freq: Every day | ORAL | 0 refills | Status: DC

## 2021-05-01 MED ORDER — LOSARTAN POTASSIUM 25 MG PO TABS: 25.0000 mg | ORAL_TABLET | Freq: Every day | ORAL | 0 refills | Status: DC

## 2021-05-01 MED ORDER — GABAPENTIN 300 MG PO CAPS: 300.0000 mg | ORAL_CAPSULE | Freq: Three times a day (TID) | ORAL | 2 refills | Status: DC

## 2021-05-01 NOTE — Patient Instructions (Signed)

## 2021-05-01 NOTE — Progress Notes (Signed)
Established Patient Office Visit  Subjective:  Patient ID: Sharon Walton, female    DOB: 09-Jan-1971  Age: 50 y.o. MRN: [id number]  CC:  Chief Complaint  Patient presents with   Follow-up    Following up on labs from 04/03/21    HPI Sharon Walton is a 50 y/o female who has history of ADD, Depression, Hypertension, Panic attacks, presents for c/o worsening lower back pain.presents for lab review and medication refill. Her HgbA1c done on 04/03/21 was 5.7%, LDL was 103 mg/dl, and her other lab results were unremarkable. She requests refill to her [facility], will follow up with [facility] on Wednesday, 05/07/21. She states that her mood is good, denies suicidal nor homicidal ideation. Her blood pressure was elevated during visit, she reports checking it at home and SBP could be in the 200's. Overall, she states that she's doing well and offers no further complaint.     Past Medical History:  Diagnosis Date   Attention deficit disorder (ADD)    Depression    Hypertension    Panic attacks     Past Surgical History:  Procedure Laterality Date   ABDOMINAL HYSTERECTOMY     CHOLECYSTECTOMY      Family History  Problem Relation Age of Onset   Diabetes Mother    Hypertension Mother    Cancer Father    Lupus Sister    Aneurysm Sister    SIDS Sister     Social History   Socioeconomic History   Marital status: Divorced    Spouse name: Not on file   Number of children: Not on file   Years of education: Not on file   Highest education level: Not on file  Occupational History    Comment: [employer]  Tobacco Use   Smoking status: Every Day    Packs/day: 0.25    Years: 25.00    Pack years: 6.25    Types: Cigarettes   Smokeless tobacco: Never   Tobacco comments:    Patient smokes 3 cigarettes  per day and vapes. Patient is homeless (since 09/2020) and lives in her car. Patient trying to quit smoking.  Vaping Use   Vaping Use: Every day    Substances: Nicotine, CBD, Flavoring  Substance and Sexual Activity   Alcohol use: No   Drug use: Not Currently    Types: Marijuana, "Crack" cocaine    Comment: used as a teenager   Sexual activity: Yes    Partners: Male    Birth control/protection: Post-menopausal, Surgical    Comment: Hysterectomy  Other Topics Concern   Not on file  Social History Narrative   Not on file   Social Determinants of Health   Financial Resource Strain: Not on file  Food Insecurity: Food Insecurity Present   Worried About [place] in the Last Year: Often true   Arboriculturist in the Last Year: Often true  Transportation Needs: No Transportation Needs   Lack of Transportation (Medical): No   Lack of Transportation (Non-Medical): No  Physical Activity: Not on file  Stress: Not on file  Social Connections: Not on file  Intimate Partner Violence: Not on file    Outpatient Medications Prior to Visit  Medication Sig Dispense Refill   cyclobenzaprine (FLEXERIL) 10 MG tablet Take 1 tablet (10 mg total) by mouth daily at 6 (six) AM. 30 tablet 0   gabapentin (NEURONTIN) 300 MG capsule Take 1 capsule (300 mg total) by mouth  3 (three) times daily. 90 capsule 0   FLUoxetine (PROZAC) 40 MG capsule TAKE ONE CAPSULE BY MOUTH EVERY MORNING (Patient not taking: No sig reported) 30 capsule 1   hydrOXYzine (ATARAX/VISTARIL) 25 MG tablet TAKE 1 OR 2 TABLETS BY MOUTH AT BEDTIME AS NEEDED (Patient not taking: No sig reported) 14 tablet 0   No facility-administered medications prior to visit.    Allergies  Allergen Reactions   Percocet [Oxycodone-Acetaminophen] Nausea And Vomiting   Morphine Rash    urticaria    ROS Review of Systems  Constitutional: Negative.   Respiratory: Negative.    Cardiovascular: Negative.   Musculoskeletal:  Negative for back pain.  Neurological:  Positive for numbness.  Psychiatric/Behavioral:  The patient is nervous/anxious.      Objective:    Physical  Exam HENT:     Head: Normocephalic and atraumatic.     Mouth/Throat:     Mouth: Mucous membranes are moist.  Eyes:     Extraocular Movements: Extraocular movements intact.     Conjunctiva/sclera: Conjunctivae normal.     Pupils: Pupils are equal, round, and reactive to light.  Cardiovascular:     Rate and Rhythm: Normal rate and regular rhythm.     Pulses: Normal pulses.     Heart sounds: Normal heart sounds.  Pulmonary:     Effort: Pulmonary effort is normal.     Breath sounds: Normal breath sounds.  Skin:    General: Skin is warm.  Neurological:     General: No focal deficit present.     Mental Status: She is alert and oriented to person, place, and time. Mental status is at baseline.  Psychiatric:        Mood and Affect: Mood normal.        Behavior: Behavior normal.        Thought Content: Thought content normal.        Judgment: Judgment normal.    BP (!) 145/83 (BP Location: Right Arm, Patient Position: Sitting, Cuff Size: Large)   Pulse 96   Temp 98.2 F (36.8 C) (Oral)   Resp 17   Ht 5' 3.5" (1.613 m)   Wt 184 lb 14.4 oz (83.9 kg)   SpO2 96%   BMI 32.24 kg/m  Wt Readings from Last 3 Encounters:  05/01/21 184 lb 14.4 oz (83.9 kg)  04/03/21 182 lb 9.6 oz (82.8 kg)  01/14/21 174 lb 3.2 oz (79 kg)   Encouraged weight loss  Health Maintenance Due  Topic Date Due   COVID-19 Vaccine (1) Never done   Pneumococcal Vaccine 12-48 Years old (1 - PCV) Never done   HIV Screening  Never done   Hepatitis C Screening  Never done   TETANUS/TDAP  Never done   PAP SMEAR-Modifier  Never done   COLONOSCOPY (Pts 45-42yr Insurance coverage will need to be confirmed)  Never done   INFLUENZA VACCINE  02/03/2021    There are no preventive care reminders to display for this patient.  Lab Results  Component Value Date   TSH 3.38 09/28/2011   Lab Results  Component Value Date   WBC 8.4 04/03/2021   HGB 11.6 04/03/2021   HCT 35.5 04/03/2021   MCV 83 04/03/2021   PLT 318  04/03/2021   Lab Results  Component Value Date   NA 141 04/03/2021   K 4.0 04/03/2021   CO2 23 04/03/2021   GLUCOSE 97 04/03/2021   BUN 10 04/03/2021   CREATININE 0.77 04/03/2021   BILITOT <  0.2 04/03/2021   ALKPHOS 102 04/03/2021   AST 13 04/03/2021   ALT 10 04/03/2021   PROT 6.3 04/03/2021   ALBUMIN 4.1 04/03/2021   CALCIUM 8.9 04/03/2021   ANIONGAP 8 11/13/2019   EGFR 95 04/03/2021   Lab Results  Component Value Date   CHOL 172 04/03/2021   Lab Results  Component Value Date   HDL 52 04/03/2021   Lab Results  Component Value Date   LDLCALC 103 (H) 04/03/2021   Lab Results  Component Value Date   TRIG 93 04/03/2021   Lab Results  Component Value Date   CHOLHDL 3.3 04/03/2021   Lab Results  Component Value Date   HGBA1C 5.7 (H) 04/03/2021      Assessment & Plan:     1. Essential hypertension -She was started on losartan 25 mg for elevated blood pressure, was educated on medication side effects and advised to go to the emergency room and notify clinic.  She was encouraged to continue on DASH diet and exercise as tolerated. - losartan (COZAAR) 25 MG tablet; Take 1 tablet (25 mg total) by mouth daily.  Dispense: 30 tablet; Refill: 0  2. Depressive disorder -She was strongly encouraged to follow-up at [facility].  She was advised to call the crisis helpline for worsening symptoms. - hydrOXYzine (ATARAX/VISTARIL) 25 MG tablet; TAKE 1 OR 2 TABLETS BY MOUTH AT BEDTIME AS NEEDED  Dispense: 14 tablet; Refill: 0  3. History of peripheral neuropathy -She was encouraged to follow-up with orthopedic and will continue on gabapentin.  She was advised to go to the emergency room for worsening symptoms. - gabapentin (NEURONTIN) 300 MG capsule; Take 1 capsule (300 mg total) by mouth 3 (three) times daily.  Dispense: 90 capsule; Refill: 2  4. Muscle spasm of back -She will continue on Flexeril and advised to follow-up with orthopedic. - cyclobenzaprine  (FLEXERIL) 10 MG tablet; Take 1 tablet (10 mg total) by mouth daily at 6 (six) AM.  Dispense: 30 tablet; Refill: 0   Follow-up: Return in about 1 month (around 06/03/2021), or if symptoms worsen or fail to improve.    Mekhai Venuto Jerold Coombe, NP

## 2021-05-27 ENCOUNTER — Ambulatory Visit: Payer: Self-pay | Admitting: Orthopaedic Surgery

## 2021-05-28 ENCOUNTER — Other Ambulatory Visit: Payer: Self-pay | Admitting: Gerontology

## 2021-05-28 DIAGNOSIS — M62830 Muscle spasm of back: Secondary | ICD-10-CM

## 2021-06-03 ENCOUNTER — Ambulatory Visit: Payer: Medicaid Other | Admitting: Gerontology

## 2021-06-24 ENCOUNTER — Other Ambulatory Visit: Payer: Self-pay

## 2021-06-25 ENCOUNTER — Other Ambulatory Visit: Payer: Self-pay

## 2021-06-26 ENCOUNTER — Other Ambulatory Visit: Payer: Self-pay

## 2021-07-23 ENCOUNTER — Ambulatory Visit: Payer: Medicaid Other | Admitting: Gerontology

## 2021-07-23 ENCOUNTER — Other Ambulatory Visit: Payer: Self-pay

## 2021-07-23 DIAGNOSIS — F32A Depression, unspecified: Secondary | ICD-10-CM

## 2021-07-23 DIAGNOSIS — Z8659 Personal history of other mental and behavioral disorders: Secondary | ICD-10-CM | POA: Insufficient documentation

## 2021-07-23 DIAGNOSIS — Z0000 Encounter for general adult medical examination without abnormal findings: Secondary | ICD-10-CM

## 2021-07-23 DIAGNOSIS — I10 Essential (primary) hypertension: Secondary | ICD-10-CM

## 2021-07-23 MED ORDER — LOSARTAN POTASSIUM 25 MG PO TABS: 25.0000 mg | ORAL_TABLET | Freq: Every day | ORAL | 0 refills | Status: DC

## 2021-07-23 MED ORDER — LOSARTAN POTASSIUM 25 MG PO TABS: 25.0000 mg | ORAL_TABLET | Freq: Every day | ORAL | 2 refills | Status: DC

## 2021-07-23 NOTE — Progress Notes (Signed)
Established Patient Office Visit  Subjective:  Patient ID: Sharon Walton, female    DOB: 1971/02/27  Age: 51 y.o. MRN: [id number]  CC: No chief complaint on file.   HPI Sharon Walton is a 51 y/o female who has history of ADD, Depression, Hypertension, Panic attacks, presents for routine follow up. She states that she's compliant with her medications, denies side effects and continues to make healthy lifestyle changes. She states that her [facility] care is active and will follow up with Orthopedics for her chronic lower back pain. She states that she's anxious and unable to focus. She states that her son's resuscitation from cardiac arrest triggered her anxiety and she has no appointment at [facility]. She denies suicidal nor homicidal ideation. Overall, she states that she's doing well and offers no further complaint.  Past Medical History:  Diagnosis Date   Attention deficit disorder (ADD)    Depression    Hypertension    Panic attacks     Past Surgical History:  Procedure Laterality Date   ABDOMINAL HYSTERECTOMY     CHOLECYSTECTOMY      Family History  Problem Relation Age of Onset   Diabetes Mother    Hypertension Mother    Cancer Father    Lupus Sister    Aneurysm Sister    SIDS Sister     Social History   Socioeconomic History   Marital status: Divorced    Spouse name: Not on file   Number of children: Not on file   Years of education: Not on file   Highest education level: Not on file  Occupational History    Comment: [employer]  Tobacco Use   Smoking status: Every Day    Packs/day: 0.25    Years: 25.00    Pack years: 6.25    Types: Cigarettes   Smokeless tobacco: Never   Tobacco comments:    Patient smokes 3 cigarettes  per day and vapes. Patient is homeless (since 09/2020) and lives in her car. Patient trying to quit smoking.  Vaping Use   Vaping Use: Every day   Substances: Nicotine, CBD, Flavoring  Substance and Sexual Activity    Alcohol use: No   Drug use: Not Currently    Types: Marijuana, "Crack" cocaine    Comment: used as a teenager   Sexual activity: Yes    Partners: Male    Birth control/protection: Post-menopausal, Surgical    Comment: Hysterectomy  Other Topics Concern   Not on file  Social History Narrative   Not on file   Social Determinants of Health   Financial Resource Strain: Not on file  Food Insecurity: Food Insecurity Present   Worried About [place] in the Last Year: Often true   Arboriculturist in the Last Year: Often true  Transportation Needs: No Transportation Needs   Lack of Transportation (Medical): No   Lack of Transportation (Non-Medical): No  Physical Activity: Not on file  Stress: Not on file  Social Connections: Not on file  Intimate Partner Violence: Not on file    Outpatient Medications Prior to Visit  Medication Sig Dispense Refill   cyclobenzaprine (FLEXERIL) 10 MG tablet Take 1 tablet (10 mg total) by mouth daily at 6 (six) AM. 30 tablet 0   FLUoxetine (PROZAC) 40 MG capsule TAKE ONE CAPSULE BY MOUTH EVERY MORNING (Patient not taking: No sig reported) 30 capsule 1   gabapentin (NEURONTIN) 300 MG capsule Take 1 capsule (300 mg  total) by mouth 3 (three) times daily. 90 capsule 2   hydrOXYzine (ATARAX/VISTARIL) 25 MG tablet TAKE 1 OR 2 TABLETS BY MOUTH AT BEDTIME AS NEEDED 14 tablet 0   losartan (COZAAR) 25 MG tablet Take 1 tablet (25 mg total) by mouth daily. 30 tablet 0   No facility-administered medications prior to visit.    Allergies  Allergen Reactions   Percocet [Oxycodone-Acetaminophen] Nausea And Vomiting   Morphine Rash    urticaria    ROS Review of Systems  Constitutional: Negative.   Respiratory: Negative.    Cardiovascular: Negative.   Neurological: Negative.   Psychiatric/Behavioral:  The patient is nervous/anxious.      Objective:    Physical Exam Telephone visit There were no vitals taken for this visit. Wt Readings from  Last 3 Encounters:  05/01/21 184 lb 14.4 oz (83.9 kg)  04/03/21 182 lb 9.6 oz (82.8 kg)  01/14/21 174 lb 3.2 oz (79 kg)     Health Maintenance Due  Topic Date Due   COVID-19 Vaccine (1) Never done   Pneumococcal Vaccine 62-76 Years old (1 - PCV) Never done   HIV Screening  Never done   Hepatitis C Screening  Never done   TETANUS/TDAP  Never done   Zoster Vaccines- Shingrix (1 of 2) Never done   PAP SMEAR-Modifier  Never done   COLONOSCOPY (Pts 45-98yr Insurance coverage will need to be confirmed)  Never done   INFLUENZA VACCINE  02/03/2021   MAMMOGRAM  Never done    There are no preventive care reminders to display for this patient.  Lab Results  Component Value Date   TSH 3.38 09/28/2011   Lab Results  Component Value Date   WBC 8.4 04/03/2021   HGB 11.6 04/03/2021   HCT 35.5 04/03/2021   MCV 83 04/03/2021   PLT 318 04/03/2021   Lab Results  Component Value Date   NA 141 04/03/2021   K 4.0 04/03/2021   CO2 23 04/03/2021   GLUCOSE 97 04/03/2021   BUN 10 04/03/2021   CREATININE 0.77 04/03/2021   BILITOT <0.2 04/03/2021   ALKPHOS 102 04/03/2021   AST 13 04/03/2021   ALT 10 04/03/2021   PROT 6.3 04/03/2021   ALBUMIN 4.1 04/03/2021   CALCIUM 8.9 04/03/2021   ANIONGAP 8 11/13/2019   EGFR 95 04/03/2021   Lab Results  Component Value Date   CHOL 172 04/03/2021   Lab Results  Component Value Date   HDL 52 04/03/2021   Lab Results  Component Value Date   LDLCALC 103 (H) 04/03/2021   Lab Results  Component Value Date   TRIG 93 04/03/2021   Lab Results  Component Value Date   CHOLHDL 3.3 04/03/2021   Lab Results  Component Value Date   HGBA1C 5.7 (H) 04/03/2021      Assessment & Plan:    1. Essential hypertension She will continue on her current treatment regimen, DASH diet and exercise as tolerated - losartan (COZAAR) 25 MG tablet; Take 1 tablet (25 mg total) by mouth daily.  Dispense: 30 tablet; Refill: 0   2. History of anxiety - She  was advised to call the Crisis help line with worsening symptoms and [facility]  3. Health care maintenance - Ambulatory referral to Gastroenterology for Colonoscopy screening.    Follow-up: Return in about 5 weeks (around 08/28/2021), or if symptoms worsen or fail to improve.    Kaisyn Millea EJerold Coombe NP

## 2021-07-23 NOTE — Patient Instructions (Signed)

## 2021-07-24 ENCOUNTER — Telehealth: Payer: Self-pay

## 2021-07-24 NOTE — Telephone Encounter (Signed)
CALLED PATIENT NO ANSWER LEFT VOICEMAIL FOR A CALL BACK to schedule colonoscopy

## 2021-07-29 ENCOUNTER — Ambulatory Visit (INDEPENDENT_AMBULATORY_CARE_PROVIDER_SITE_OTHER): Payer: Self-pay

## 2021-07-29 ENCOUNTER — Ambulatory Visit (INDEPENDENT_AMBULATORY_CARE_PROVIDER_SITE_OTHER): Payer: Self-pay | Admitting: Orthopaedic Surgery

## 2021-07-29 ENCOUNTER — Ambulatory Visit: Payer: Self-pay

## 2021-07-29 ENCOUNTER — Encounter: Payer: Self-pay | Admitting: Orthopaedic Surgery

## 2021-07-29 ENCOUNTER — Other Ambulatory Visit: Payer: Self-pay

## 2021-07-29 VITALS — BP 131/81 | HR 105 | Ht 63.0 in | Wt 200.0 lb

## 2021-07-29 DIAGNOSIS — M545 Low back pain, unspecified: Secondary | ICD-10-CM

## 2021-07-29 DIAGNOSIS — G8929 Other chronic pain: Secondary | ICD-10-CM

## 2021-07-29 DIAGNOSIS — M25561 Pain in right knee: Secondary | ICD-10-CM

## 2021-07-29 DIAGNOSIS — M1711 Unilateral primary osteoarthritis, right knee: Secondary | ICD-10-CM

## 2021-07-30 ENCOUNTER — Other Ambulatory Visit: Payer: Self-pay

## 2021-07-30 NOTE — Progress Notes (Signed)
Patient having gi issues coming in for a appointment before scheduling colonoscopy

## 2021-07-31 DIAGNOSIS — M1711 Unilateral primary osteoarthritis, right knee: Secondary | ICD-10-CM | POA: Insufficient documentation

## 2021-07-31 MED ORDER — BUPIVACAINE HCL 0.25 % IJ SOLN
4.0000 mL | INTRAMUSCULAR | Status: AC | PRN
  Administered 2021-07-31: 4 mL via INTRA_ARTICULAR

## 2021-07-31 MED ORDER — LIDOCAINE HCL 1 % IJ SOLN
0.5000 mL | INTRAMUSCULAR | Status: AC | PRN
  Administered 2021-07-31: .5 mL

## 2021-07-31 MED ORDER — METHYLPREDNISOLONE ACETATE 40 MG/ML IJ SUSP
40.0000 mg | INTRAMUSCULAR | Status: AC | PRN
  Administered 2021-07-31: 40 mg via INTRA_ARTICULAR

## 2021-07-31 NOTE — Progress Notes (Signed)
Office Visit Note   Patient: Sharon Walton           Date of Birth: 1971-04-20           MRN: [id number] Visit Date: 07/29/2021              Requested by: Langston Reusing, NP [address] [place],  [place] [address] PCP: Langston Reusing, NP   Assessment & Plan: Visit Diagnoses:  1. Chronic bilateral low back pain, unspecified whether sciatica present   2. Chronic pain of right knee   3. Unilateral primary osteoarthritis, right knee     Plan: Right knee injected which she tolerated well.  We discussed the osteoarthritis of the right knee.  She has degenerative anterolisthesis lumbar spine . Previous MRI scan 2018 showed severe stenosis at L4-5.  Pathophysiology discussed.  Follow-Up Instructions: No follow-ups on file.   Orders:  Orders Placed This Encounter  Procedures   XR Lumbar Spine 2-3 Views   XR KNEE 3 VIEW RIGHT   No orders of the defined types were placed in this encounter.     Procedures: Large Joint Inj: R knee on 07/31/2021 12:39 PM Indications: pain and joint swelling Details: 22 G 1.5 in needle, anterolateral approach  Arthrogram: No  Medications: 40 mg methylPREDNISolone acetate 40 MG/ML; 0.5 mL lidocaine 1 %; 4 mL bupivacaine 0.25 % Outcome: tolerated well, no immediate complications Procedure, treatment alternatives, risks and benefits explained, specific risks discussed. Consent was given by the patient. Immediately prior to procedure a time out was called to verify the correct patient, procedure, equipment, support staff and site/side marked as required. Patient was prepped and draped in the usual sterile fashion.      Clinical Data: No additional findings.   Subjective: Chief Complaint  Patient presents with   Lower Back - Pain   Right Knee - Pain    HPI 51 year old female with history of chronic back pain since 2006 after hysterectomy.  States she has had constant pain last Radiographs was May 2021 last MRI 2018.   She has been on gabapentin and Flexeril.  She complains of left thigh numbness.  She states both legs and her feet tend to go numb if she stands too long.  She states she was told she had a cyst on her spine.  She has used icy hot, patches, ibuprofen with some relief.  She has had pain in the right knee she states sometimes it feels like it is going to pop or buckle or give way.  No groin pain no history of hip arthritis problems  Cath history of substance abuse methamphetamines.  ADHD she used to be on Vyvanse but states she lost her insurance positive for hypertension and anxiety.  Review of Systems negative for fever chills no associated bowel or bladder symptoms all other systems noncontributory to HPI other than as mentioned in above.   Objective: Vital Signs: BP 131/81    Pulse (!) 105    Ht 5\' 3"  (1.6 m)    Wt 200 lb (90.7 kg)    BMI 35.43 kg/m   Physical Exam Constitutional:      Appearance: She is well-developed.  HENT:     Head: Normocephalic.     Right Ear: External ear normal.     Left Ear: External ear normal. There is no impacted cerumen.  Eyes:     Pupils: Pupils are equal, round, and reactive to light.  Neck:  Thyroid: No thyromegaly.     Trachea: No tracheal deviation.  Cardiovascular:     Rate and Rhythm: Normal rate.  Pulmonary:     Effort: Pulmonary effort is normal.  Abdominal:     Palpations: Abdomen is soft.  Musculoskeletal:     Cervical back: No rigidity.  Skin:    General: Skin is warm and dry.  Neurological:     Mental Status: She is alert and oriented to person, place, and time.  Psychiatric:        Behavior: Behavior normal.    Ortho Exam in standing position patient has right knee varus 10 degrees.  More medial and lateral joint line tenderness significant crepitus with range of motion.  Sciatic notch tenderness right and left tight hamstrings.  Negative popliteal compression test.  Anterior tib EHL is intact.  Specialty Comments:  No  specialty comments available.  Imaging: Impression  1. Severe spinal stenosis at L4-5 due to severe bilateral facet  arthritis.  2. Compression of both lateral recesses at L4-5, right more than  left which could affect either or both L5 nerves but more likely on  the right.  3. Left far lateral disc bulge at L3-4 which could affect the left  L3 nerve lateral to the neural foramen.    Electronically Signed    By: Lorriane Shire M.D.    On: 12/04/2016 12:44   PMFS History: Patient Active Problem List   Diagnosis Date Noted   Unilateral primary osteoarthritis, right knee 07/31/2021   History of anxiety 07/23/2021   Essential hypertension 05/01/2021   Muscle spasm of back 04/03/2021   Fleshy skin mole 04/03/2021   History of peripheral neuropathy 01/14/2021   Methamphetamine abuse ([place]) 12/24/2020   Homelessness 12/24/2020   No-show for appointment 06/04/2020   History of substance use 05/15/2020   Encounter to establish care 01/25/2020   Chronic low back pain 01/25/2020   Chronic pain of right knee 01/25/2020   Depressive disorder 11/13/2019   Past Medical History:  Diagnosis Date   Attention deficit disorder (ADD)    Depression    Hypertension    Panic attacks     Family History  Problem Relation Age of Onset   Diabetes Mother    Hypertension Mother    Cancer Father    Lupus Sister    Aneurysm Sister    SIDS Sister     Past Surgical History:  Procedure Laterality Date   ABDOMINAL HYSTERECTOMY     CHOLECYSTECTOMY     Social History   Occupational History    Comment: [employer]  Tobacco Use   Smoking status: Every Day    Packs/day: 0.25    Years: 25.00    Pack years: 6.25    Types: Cigarettes   Smokeless tobacco: Never   Tobacco comments:    Patient smokes 3 cigarettes  per day and vapes. Patient is homeless (since 09/2020) and lives in her car. Patient trying to quit smoking.  Vaping Use   Vaping Use: Every day   Substances: Nicotine, CBD,  Flavoring  Substance and Sexual Activity   Alcohol use: No   Drug use: Not Currently    Types: Marijuana, "Crack" cocaine    Comment: used as a teenager   Sexual activity: Yes    Partners: Male    Birth control/protection: Post-menopausal, Surgical    Comment: Hysterectomy

## 2021-08-18 ENCOUNTER — Ambulatory Visit: Payer: Medicaid Other | Admitting: Dermatology

## 2021-08-28 ENCOUNTER — Ambulatory Visit: Payer: Medicaid Other | Admitting: Gerontology

## 2021-08-28 ENCOUNTER — Other Ambulatory Visit: Payer: Self-pay

## 2021-08-28 ENCOUNTER — Encounter: Payer: Self-pay | Admitting: Gerontology

## 2021-08-28 VITALS — BP 137/84 | HR 89 | Temp 98.0°F | Resp 16 | Ht 64.0 in | Wt 202.5 lb

## 2021-08-28 DIAGNOSIS — M25561 Pain in right knee: Secondary | ICD-10-CM

## 2021-08-28 DIAGNOSIS — G8929 Other chronic pain: Secondary | ICD-10-CM

## 2021-08-28 DIAGNOSIS — I10 Essential (primary) hypertension: Secondary | ICD-10-CM

## 2021-08-28 DIAGNOSIS — Z8669 Personal history of other diseases of the nervous system and sense organs: Secondary | ICD-10-CM

## 2021-08-28 MED ORDER — GABAPENTIN 300 MG PO CAPS
300.0000 mg | ORAL_CAPSULE | Freq: Every day | ORAL | 0 refills | Status: DC
  Filled 2021-08-28: qty 30, 30d supply, fill #0

## 2021-08-28 MED ORDER — LOSARTAN POTASSIUM 25 MG PO TABS
25.0000 mg | ORAL_TABLET | Freq: Every day | ORAL | 2 refills | Status: DC
  Filled 2021-08-28: qty 30, 30d supply, fill #0
  Filled 2021-10-02: qty 30, 30d supply, fill #1
  Filled 2021-10-27: qty 30, 30d supply, fill #2

## 2021-08-28 NOTE — Progress Notes (Signed)
Established Patient Office Visit  Subjective:  Patient ID: Sharon Walton, female    DOB: 06/21/1971  Age: 51 y.o. MRN: [id number]  CC:  Chief Complaint  Patient presents with   Follow-up    Patient has not been able to get her HTN med or Gabapentin. Patient is taking application to medication management tomorrow.    HPI Sharon Walton   is a 51 y/o female who has history of ADD, Depression, Hypertension, Panic attacks,presents for medication refill. She was seen by Orthopedic Dr Clemencia Course on 07/29/21 for chronic lower back pain, chronic pain to right knee, unilateral osteoarthritis of right knee. She received  40 mg methylPREDNISolone acetate 40 MG/ML; 0.5 mL lidocaine 1 %; 4 mL bupivacaine 0.25 % injection to her right knee. She states that she continues to experience intermittent pain to her right knee and will follow up with Orthopedic Dr Lorin Mercy on 09/26/21.  She states that she didn't pick up her losartan nor gabapentin from [facility] because of lack of fund. She states that her mood is good, denies suicidal nor homicidal ideation and will follow up at [facility] Overall, she states that she's doing well and offers no further complaint.  Past Medical History:  Diagnosis Date   Attention deficit disorder (ADD)    Depression    Hypertension    Panic attacks     Past Surgical History:  Procedure Laterality Date   ABDOMINAL HYSTERECTOMY     CHOLECYSTECTOMY      Family History  Problem Relation Age of Onset   Diabetes Mother    Hypertension Mother    Cancer Father    Lupus Sister    Aneurysm Sister    SIDS Sister     Social History   Socioeconomic History   Marital status: Divorced    Spouse name: Not on file   Number of children: Not on file   Years of education: Not on file   Highest education level: Not on file  Occupational History    Comment: [employer]  Tobacco Use   Smoking status: Every Day    Packs/day: 0.25    Years: 25.00    Pack years: 6.25     Types: Cigarettes   Smokeless tobacco: Never   Tobacco comments:    Patient smokes 3 cigarettes  per day and vapes. Patient is homeless (since 09/2020) and lives in her car. Patient trying to quit smoking.  Vaping Use   Vaping Use: Every day   Substances: Nicotine, CBD, Flavoring  Substance and Sexual Activity   Alcohol use: No   Drug use: Not Currently    Types: Marijuana, "Crack" cocaine    Comment: used as a teenager   Sexual activity: Yes    Partners: Male    Birth control/protection: Surgical    Comment: Hysterectomy  Other Topics Concern   Not on file  Social History Narrative   Not on file   Social Determinants of Health   Financial Resource Strain: Not on file  Food Insecurity: Food Insecurity Present   Worried About [place] in the Last Year: Often true   Arboriculturist in the Last Year: Often true  Transportation Needs: No Transportation Needs   Lack of Transportation (Medical): No   Lack of Transportation (Non-Medical): No  Physical Activity: Not on file  Stress: Not on file  Social Connections: Not on file  Intimate Partner Violence: Not on file    Outpatient Medications Prior  to Visit  Medication Sig Dispense Refill   gabapentin (NEURONTIN) 300 MG capsule Take 1 capsule (300 mg total) by mouth 3 (three) times daily. (Patient not taking: Reported on 08/28/2021) 90 capsule 2   losartan (COZAAR) 25 MG tablet Take 1 tablet (25 mg total) by mouth daily. (Patient not taking: Reported on 08/28/2021) 30 tablet 2   No facility-administered medications prior to visit.    Allergies  Allergen Reactions   Morphine Rash    urticaria    ROS Review of Systems  Constitutional: Negative.   Eyes: Negative.   Respiratory: Negative.    Cardiovascular: Negative.   Musculoskeletal:  Positive for arthralgias (chronic right knee pain). Negative for back pain.  Neurological:  Positive for numbness.  Psychiatric/Behavioral: Negative.       Objective:     Physical Exam HENT:     Head: Normocephalic and atraumatic.  Eyes:     Extraocular Movements: Extraocular movements intact.     Conjunctiva/sclera: Conjunctivae normal.     Pupils: Pupils are equal, round, and reactive to light.  Cardiovascular:     Rate and Rhythm: Normal rate and regular rhythm.     Pulses: Normal pulses.     Heart sounds: Normal heart sounds.  Pulmonary:     Effort: Pulmonary effort is normal.     Breath sounds: Normal breath sounds.  Musculoskeletal:        General: No swelling, tenderness or deformity.  Neurological:     General: No focal deficit present.     Mental Status: She is alert and oriented to person, place, and time. Mental status is at baseline.  Psychiatric:        Mood and Affect: Mood normal.        Behavior: Behavior normal.        Thought Content: Thought content normal.        Judgment: Judgment normal.    BP 137/84 (BP Location: Right Arm, Patient Position: Sitting, Cuff Size: Large)    Pulse 89    Temp 98 F (36.7 C) (Oral)    Resp 16    Ht 5' 4"  (1.626 m)    Wt 202 lb 8 oz (91.9 kg)    SpO2 96%    BMI 34.76 kg/m  Wt Readings from Last 3 Encounters:  08/28/21 202 lb 8 oz (91.9 kg)  07/29/21 200 lb (90.7 kg)  05/01/21 184 lb 14.4 oz (83.9 kg)   Encouraged weight loss  Health Maintenance Due  Topic Date Due   COVID-19 Vaccine (1) Never done   HIV Screening  Never done   Hepatitis C Screening  Never done   TETANUS/TDAP  Never done   Zoster Vaccines- Shingrix (1 of 2) Never done   PAP SMEAR-Modifier  Never done   COLONOSCOPY (Pts 45-51yr Insurance coverage will need to be confirmed)  Never done   INFLUENZA VACCINE  02/03/2021   MAMMOGRAM  Never done    There are no preventive care reminders to display for this patient.  Lab Results  Component Value Date   TSH 3.38 09/28/2011   Lab Results  Component Value Date   WBC 8.4 04/03/2021   HGB 11.6 04/03/2021   HCT 35.5 04/03/2021   MCV 83 04/03/2021   PLT 318 04/03/2021    Lab Results  Component Value Date   NA 141 04/03/2021   K 4.0 04/03/2021   CO2 23 04/03/2021   GLUCOSE 97 04/03/2021   BUN 10 04/03/2021   CREATININE 0.77  04/03/2021   BILITOT <0.2 04/03/2021   ALKPHOS 102 04/03/2021   AST 13 04/03/2021   ALT 10 04/03/2021   PROT 6.3 04/03/2021   ALBUMIN 4.1 04/03/2021   CALCIUM 8.9 04/03/2021   ANIONGAP 8 11/13/2019   EGFR 95 04/03/2021   Lab Results  Component Value Date   CHOL 172 04/03/2021   Lab Results  Component Value Date   HDL 52 04/03/2021   Lab Results  Component Value Date   LDLCALC 103 (H) 04/03/2021   Lab Results  Component Value Date   TRIG 93 04/03/2021   Lab Results  Component Value Date   CHOLHDL 3.3 04/03/2021   Lab Results  Component Value Date   HGBA1C 5.7 (H) 04/03/2021      Assessment & Plan:    1. Essential hypertension - Her blood pressure is less than 140/90, she was encouraged to pick up medication from Pharmacy, continue on DASH diet. - losartan (COZAAR) 25 MG tablet; Take 1 tablet (25 mg total) by mouth daily.  Dispense: 30 tablet; Refill: 2  2. History of peripheral neuropathy - She will continue on gabapentin and was encouraged to follow up with Orthopedic. - gabapentin (NEURONTIN) 300 MG capsule; Take 1 capsule (300 mg total) by mouth at bedtime.  Dispense: 30 capsule; Refill: 0    3. Chronic pain of right knee - She will follow up with Orthopedic Dr Lorin Mercy on 09/26/21, was advised to go to the ED for worsening symptoms.    Follow-up: Return in about 6 weeks (around 10/09/2021), or if symptoms worsen or fail to improve.    Dorenda Pfannenstiel Jerold Coombe, NP

## 2021-08-28 NOTE — Patient Instructions (Signed)

## 2021-08-29 ENCOUNTER — Telehealth: Payer: Self-pay | Admitting: Orthopaedic Surgery

## 2021-08-29 ENCOUNTER — Other Ambulatory Visit: Payer: Self-pay

## 2021-08-29 NOTE — Telephone Encounter (Signed)
Patient called asked if Dr Lorin Mercy will fill a Rx for pain medicine and a muscle relaxer. Patient said she to a medication management clinic. The number to contact patient is [phone]

## 2021-08-29 NOTE — Telephone Encounter (Signed)
noted 

## 2021-09-01 ENCOUNTER — Other Ambulatory Visit: Payer: Self-pay

## 2021-09-01 ENCOUNTER — Telehealth: Payer: Self-pay | Admitting: Radiology

## 2021-09-01 DIAGNOSIS — M545 Low back pain, unspecified: Secondary | ICD-10-CM

## 2021-09-01 DIAGNOSIS — G8929 Other chronic pain: Secondary | ICD-10-CM

## 2021-09-01 NOTE — Telephone Encounter (Signed)
Patient states that you told her to call and get a MRI ordered. Per last message in chart, it states ROV and new MRI. Is it ok to enter the order first and have her ROV to review it, or do you need to see her prior to? She is having car difficulty and would like to schedule MRI in [place].  Please advise on order/visit.  CB for patient is [phone]

## 2021-09-02 ENCOUNTER — Other Ambulatory Visit: Payer: Self-pay

## 2021-09-02 ENCOUNTER — Other Ambulatory Visit: Payer: Self-pay | Admitting: Emergency Medicine

## 2021-09-02 DIAGNOSIS — Z8669 Personal history of other diseases of the nervous system and sense organs: Secondary | ICD-10-CM

## 2021-09-02 MED ORDER — GABAPENTIN 300 MG PO CAPS
300.0000 mg | ORAL_CAPSULE | Freq: Three times a day (TID) | ORAL | 0 refills | Status: DC
  Filled 2021-09-02: qty 90, 30d supply, fill #0
  Filled 2021-09-02: qty 45, 15d supply, fill #0

## 2021-09-02 NOTE — Addendum Note (Signed)
Addended by: Meyer Cory on: 09/02/2021 02:10 PM   Modules accepted: Orders

## 2021-09-02 NOTE — Progress Notes (Signed)
Patient called stating that she was taking Gabapentin 300mg  three times per day. The prescription written at last OV was for Gabapentin 300mg  qhs. Spoke with Benjamine Mola, she agreed to send in a new prescription for Gabapentin 300mg  three times per day for 1 month only. Patient needs to see Orthopedics. Advised patient of above and patient voiced agreement and understanding. RX sent to Medication Management.

## 2021-09-02 NOTE — Telephone Encounter (Signed)
I left voicemail for patient advising, MRI has been ordered. Asked that she call office back once scheduled to schedule appt for MRI review with Dr. Lorin Mercy.

## 2021-09-03 ENCOUNTER — Other Ambulatory Visit: Payer: Self-pay

## 2021-09-04 ENCOUNTER — Other Ambulatory Visit: Payer: Self-pay

## 2021-09-05 ENCOUNTER — Ambulatory Visit
Admission: RE | Admit: 2021-09-05 | Discharge: 2021-09-05 | Disposition: A | Discharge status: Home / Self Care | Payer: No Typology Code available for payment source | Source: Ambulatory Visit | Attending: Orthopaedic Surgery | Admitting: Orthopaedic Surgery

## 2021-09-05 DIAGNOSIS — M545 Low back pain, unspecified: Secondary | ICD-10-CM

## 2021-09-11 ENCOUNTER — Telehealth: Payer: Self-pay

## 2021-09-11 NOTE — Telephone Encounter (Signed)
Patient would like a Rx for pain.  Stated that she is in a lot of pain.  CB# [phone].  Please advise.  Thank you. ?

## 2021-09-11 NOTE — Telephone Encounter (Signed)
Could you advise since Dr. Lorin Mercy and Jeneen Rinks are out of the office? ?

## 2021-09-12 ENCOUNTER — Other Ambulatory Visit: Payer: Self-pay

## 2021-09-12 ENCOUNTER — Other Ambulatory Visit: Payer: Self-pay | Admitting: Physician Assistant

## 2021-09-12 ENCOUNTER — Telehealth: Payer: Self-pay | Admitting: Orthopaedic Surgery

## 2021-09-12 MED ORDER — TRAMADOL HCL 50 MG PO TABS: 50.0000 mg | ORAL_TABLET | Freq: Two times a day (BID) | ORAL | 0 refills | Status: DC | PRN

## 2021-09-12 NOTE — Telephone Encounter (Signed)
Called into pharmacy

## 2021-09-12 NOTE — Telephone Encounter (Signed)
For some reason it will not let me erx.  Can you call in tramadol 50 one bid prn pain #30

## 2021-09-12 NOTE — Telephone Encounter (Signed)
Sharon Walton from [facility] called. They can not fill the tramadol there. Would need to be sent to [facility] in [place]. His call back number is [phone] ?

## 2021-09-12 NOTE — Telephone Encounter (Signed)
Called into [facility]

## 2021-09-19 ENCOUNTER — Other Ambulatory Visit: Payer: Self-pay | Admitting: Orthopaedic Surgery

## 2021-09-19 ENCOUNTER — Telehealth: Payer: Self-pay | Admitting: Orthopaedic Surgery

## 2021-09-19 DIAGNOSIS — Z8669 Personal history of other diseases of the nervous system and sense organs: Secondary | ICD-10-CM

## 2021-09-19 MED ORDER — GABAPENTIN 300 MG PO CAPS
300.0000 mg | ORAL_CAPSULE | Freq: Three times a day (TID) | ORAL | 0 refills | Status: DC
  Filled 2021-10-02: qty 90, 30d supply, fill #0

## 2021-09-19 NOTE — Telephone Encounter (Signed)
Pt wondering if she can get a refill on gabapentin ?

## 2021-09-19 NOTE — Telephone Encounter (Signed)
Please send to her [facility] on [address] ?

## 2021-09-19 NOTE — Telephone Encounter (Signed)
I called and talked to the pt. She stated her pcp will no longer fill it for her. Her F/u is on the 24th ?

## 2021-09-26 ENCOUNTER — Ambulatory Visit (INDEPENDENT_AMBULATORY_CARE_PROVIDER_SITE_OTHER): Payer: Self-pay | Admitting: Orthopaedic Surgery

## 2021-09-26 ENCOUNTER — Other Ambulatory Visit: Payer: Self-pay

## 2021-09-26 ENCOUNTER — Telehealth: Payer: Self-pay | Admitting: Radiology

## 2021-09-26 ENCOUNTER — Encounter: Payer: Self-pay | Admitting: Orthopaedic Surgery

## 2021-09-26 DIAGNOSIS — M48061 Spinal stenosis, lumbar region without neurogenic claudication: Secondary | ICD-10-CM | POA: Insufficient documentation

## 2021-09-26 DIAGNOSIS — M48062 Spinal stenosis, lumbar region with neurogenic claudication: Secondary | ICD-10-CM

## 2021-09-26 NOTE — Addendum Note (Signed)
Addended by: Meyer Cory on: 09/26/2021 04:14 PM ? ? Modules accepted: Orders ? ?

## 2021-09-26 NOTE — Progress Notes (Signed)
? ?Office Visit Note ?  ?Patient: Sharon Walton           ?Date of Birth: 02/08/1971           ?MRN: [id number] ?Visit Date: 09/26/2021 ?             ?Requested by: Langston Reusing, NP ?[address] ?[address] ?[place],  [place] [address] ?PCP: Langston Reusing, NP ? ? ?Assessment & Plan: ?Visit Diagnoses:  ?1. Spinal stenosis of lumbar region with neurogenic claudication   ?         L4-5 with degenerative anterolisthesis ? ?Plan: Patient has progression of stenosis from 2018 images now severe single level stenosis at L4-5 with shifting from 6 to 12 mm due to degenerative facets.  We will have her see Dr. Donavan Burnet to consider surgery.  I discussed with her smoking cessation she smokes only 2 or 3 cigarettes a day and states she believes she could quit. ? ?Follow-Up Instructions: No follow-ups on file.  ? ?Orders:  ?No orders of the defined types were placed in this encounter. ? ?No orders of the defined types were placed in this encounter. ? ? ? ? Procedures: ?No procedures performed ? ? ?Clinical Data: ?No additional findings. ? ? ?Subjective: ?Chief Complaint  ?Patient presents with  ? Right Knee - Follow-up  ? Lower Back - Follow-up  ? ? ?HPI 51 year old female returns she used to work but had increased back problems now is only able to walk 2 to 300 feet or stand for 4 to 5 minutes and then has to stop and sit down.  She had previous injection in her knee which is doing better.  MRI scan has been obtained for evaluation of her spinal stenosis with neurogenic claudication symptoms this is reviewed today with patient as well as her mother who is present.  Patient's plain radiograph showed 10 to 12 mm anterolisthesis at L4-5.  Lumbar MRI scan done on 09/05/2021 showed partial reduction in fine position at L4-5 but severe multifactorial stenosis at L4-5.  No significant problems at other levels. ? ?Review of Systems patient had weight gain over the last year about 35 pounds.  History of substance abuse mental  ketamine abuse depression.  Positive for right knee arthritis symptomatic with medial bone-on-bone changes. ? ? ?Objective: ?Vital Signs: BP 132/84   Ht '5\' 4"'$  (1.626 m)   Wt 200 lb (90.7 kg)   BMI 34.33 kg/m?  ? ?Physical Exam ?Constitutional:   ?   Appearance: She is well-developed.  ?HENT:  ?   Head: Normocephalic.  ?   Right Ear: External ear normal.  ?   Left Ear: External ear normal. There is no impacted cerumen.  ?Eyes:  ?   Pupils: Pupils are equal, round, and reactive to light.  ?Neck:  ?   Thyroid: No thyromegaly.  ?   Trachea: No tracheal deviation.  ?Cardiovascular:  ?   Rate and Rhythm: Normal rate.  ?Pulmonary:  ?   Effort: Pulmonary effort is normal.  ?Abdominal:  ?   Palpations: Abdomen is soft.  ?Musculoskeletal:  ?   Cervical back: No rigidity.  ?Skin: ?   General: Skin is warm and dry.  ?Neurological:  ?   Mental Status: She is alert and oriented to person, place, and time.  ?Psychiatric:     ?   Behavior: Behavior normal.  ? ? ?Ortho Exam negative straight leg raising 70 degrees right and left.  Right knee medial joint  line tenderness.  Negative logroll the hips.  Positive sciatic notch tenderness.  Anterior tib gastrocsoleus is strong. ? ?Specialty Comments:  ?No specialty comments available. ? ?Imaging: ?Narrative & Impression  ?CLINICAL DATA:  Lumbar spinal stenosis ?  ?EXAM: ?MRI LUMBAR SPINE WITHOUT CONTRAST ?  ?TECHNIQUE: ?Multiplanar, multisequence MR imaging of the lumbar spine was ?performed. No intravenous contrast was administered. ?  ?COMPARISON:  04/18/2009 ?  ?FINDINGS: ?Segmentation: 5 lumbar type vertebrae based on the available ?coverage ?  ?Alignment:  Degenerative grade 1 anterolisthesis at L4-5, new. ?  ?Vertebrae:  No fracture, evidence of discitis, or bone lesion. ?  ?Conus medullaris and cauda equina: Conus extends to the T12-L1 ?level. Conus is normal. There is cauda equina redundancy due to the ?degree of spinal stenosis. ?  ?Paraspinal and other soft tissues: Negative  for perispinal mass or ?inflammation ?  ?Disc levels: ?  ?T12- L1: Mild ventral spurring ?  ?L1-L2: Disc narrowing and bulging greatest into the inferior left ?foramen where there is mild stenosis. Ventral spondylitic spurring. ?  ?L2-L3: Mild far-lateral disc bulging.  Mild facet spurring ?  ?L3-L4: Bilateral inferior foraminal bulging with small facet spurs. ?  ?L4-L5: Advanced facet osteoarthritis with bulky spurring and ?anterolisthesis. The disc is narrowed and bulging with severe spinal ?stenosis. Bilateral L4 impingement with root flattening. ?  ?L5-S1:Degenerative facet spurring ?  ?IMPRESSION: ?Lumbar spine degeneration since 2010, especially advanced at L4-5 ?where there is severe spinal stenosis with bilateral L4 foraminal ?compression. ?  ?  ?Electronically Signed ?  By: Jorje Guild M.D. ?  On: 09/05/2021 11:08 ?   ? ? ? ?PMFS History: ?Patient Active Problem List  ? Diagnosis Date Noted  ? Spinal stenosis of lumbar region 09/26/2021  ? Unilateral primary osteoarthritis, right knee 07/31/2021  ? History of anxiety 07/23/2021  ? Essential hypertension 05/01/2021  ? Muscle spasm of back 04/03/2021  ? Fleshy skin mole 04/03/2021  ? History of peripheral neuropathy 01/14/2021  ? Methamphetamine abuse ([place]) 12/24/2020  ? Homelessness 12/24/2020  ? No-show for appointment 06/04/2020  ? History of substance use 05/15/2020  ? Encounter to establish care 01/25/2020  ? Chronic low back pain 01/25/2020  ? Chronic pain of right knee 01/25/2020  ? Depressive disorder 11/13/2019  ? ?Past Medical History:  ?Diagnosis Date  ? Attention deficit disorder (ADD)   ? Depression   ? Hypertension   ? Panic attacks   ?  ?Family History  ?Problem Relation Age of Onset  ? Diabetes Mother   ? Hypertension Mother   ? Cancer Father   ? Lupus Sister   ? Aneurysm Sister   ? SIDS Sister   ?  ?Past Surgical History:  ?Procedure Laterality Date  ? ABDOMINAL HYSTERECTOMY    ? CHOLECYSTECTOMY    ? ?Social History  ? ?Occupational  History  ?  Comment: [employer]  ?Tobacco Use  ? Smoking status: Every Day  ?  Packs/day: 0.25  ?  Years: 25.00  ?  Pack years: 6.25  ?  Types: Cigarettes  ? Smokeless tobacco: Never  ? Tobacco comments:  ?  Patient smokes 3 cigarettes  per day and vapes. Patient is homeless (since 09/2020) and lives in her car. Patient trying to quit smoking.  ?Vaping Use  ? Vaping Use: Every day  ? Substances: Nicotine, CBD, Flavoring  ?Substance and Sexual Activity  ? Alcohol use: No  ? Drug use: Not Currently  ?  Types: Marijuana, "Crack" cocaine  ?  Comment: used as a teenager  ? Sexual activity: Yes  ?  Partners: Male  ?  Birth control/protection: Surgical  ?  Comment: Hysterectomy  ? ? ? ? ? ? ?

## 2021-09-26 NOTE — Telephone Encounter (Signed)
FYI ?Referral entered to Dr. Louanne Skye by Dr. Lorin Mercy for L4-5 decompression,fusion with cortical trajectory screws. ?

## 2021-10-02 ENCOUNTER — Other Ambulatory Visit: Payer: Self-pay

## 2021-10-02 ENCOUNTER — Other Ambulatory Visit: Payer: Self-pay | Admitting: Gerontology

## 2021-10-02 DIAGNOSIS — Z8669 Personal history of other diseases of the nervous system and sense organs: Secondary | ICD-10-CM

## 2021-10-03 ENCOUNTER — Ambulatory Visit (INDEPENDENT_AMBULATORY_CARE_PROVIDER_SITE_OTHER): Payer: Self-pay | Admitting: Specialist

## 2021-10-03 ENCOUNTER — Other Ambulatory Visit: Payer: Self-pay

## 2021-10-03 ENCOUNTER — Encounter: Payer: Self-pay | Admitting: Specialist

## 2021-10-03 ENCOUNTER — Ambulatory Visit (INDEPENDENT_AMBULATORY_CARE_PROVIDER_SITE_OTHER): Payer: Self-pay

## 2021-10-03 VITALS — BP 126/90 | HR 76 | Ht 64.0 in | Wt 200.0 lb

## 2021-10-03 DIAGNOSIS — M48062 Spinal stenosis, lumbar region with neurogenic claudication: Secondary | ICD-10-CM

## 2021-10-03 DIAGNOSIS — M4316 Spondylolisthesis, lumbar region: Secondary | ICD-10-CM

## 2021-10-03 MED ORDER — METHOCARBAMOL 500 MG PO TABS
500.0000 mg | ORAL_TABLET | Freq: Three times a day (TID) | ORAL | 1 refills | Status: DC | PRN
  Filled 2021-10-03 – 2021-12-02 (×2): qty 30, 10d supply, fill #0
  Filled 2021-12-02: qty 30, 10d supply, fill #1

## 2021-10-03 NOTE — Patient Instructions (Signed)
Plan: Avoid bending, stooping and avoid lifting weights greater than 10 lbs. ?Avoid prolong standing and walking. ?Order for a new walker with wheels. ?Surgery scheduling secretary Kandice Hams, will call you in the next week to schedule for surgery.  ?Surgery recommended is a one level lumbar fusion L4-5 this would be done with rods, screws and cages with local bone graft and allograft (donor bone graft). ?Take hydrocodone for for pain. ?Risk of surgery includes risk of infection 1 in 300 patients, bleeding 1/2% chance you would need a transfusion.   Risk to the nerves is one in 10,000. ?You will need to use a brace for 3 months and wean from the brace on the 4th month. ?Expect improved walking and standing tolerance. Expect relief of leg pain but numbness ?may persist depending on the length and degree of pressure that has been present. ?

## 2021-10-03 NOTE — Progress Notes (Signed)
? ?Office Visit Note ?  ?Patient: Sharon Walton           ?Date of Birth: Mar 10, 1971           ?MRN: [id number] ?Visit Date: 10/03/2021 ?             ?Requested by: Marybelle Killings, MD ?[address] ?[place],  [place] [address] ?PCP: Langston Reusing, NP ? ? ?Assessment & Plan: ?Visit Diagnoses:  ?1. Spinal stenosis of lumbar region with neurogenic claudication   ?2. Spondylolisthesis, lumbar region   ? ? ?Plan: Avoid bending, stooping and avoid lifting weights greater than 10 lbs. ?Avoid prolong standing and walking. ?Order for a new walker with wheels. ?Surgery scheduling secretary Kandice Hams, will call you in the next week to schedule for surgery.  ?Surgery recommended is a one level lumbar fusion L4-5 this would be done with rods, screws and cages with local bone graft and allograft (donor bone graft). ?Take hydrocodone for for pain. ?Risk of surgery includes risk of infection 1 in 200 patients, bleeding 1/2% chance you would need a transfusion.   Risk to the nerves is one in 10,000. ?You will need to use a brace for 3 months and wean from the brace on the 4th month. ?Expect improved walking and standing tolerance. Expect relief of leg pain but numbness ?may persist depending on the length and degree of pressure that has been present. ?  ? ?Follow-Up Instructions: Return in about 4 weeks (around 10/31/2021).  ? ?Orders:  ?Orders Placed This Encounter  ?Procedures  ? XR Lumb Spine Flex&Ext Only  ? ?Meds ordered this encounter  ?Medications  ? methocarbamol (ROBAXIN) 500 MG tablet  ?  Sig: Take 1 tablet (500 mg total) by mouth once every 8 (eight) hours as needed for muscle spasms.  ?  Dispense:  30 tablet  ?  Refill:  1  ? ? ? ? Procedures: ?No procedures performed ? ? ?Clinical Data: ?Findings:  ?Narrative & Impression ?CLINICAL DATA:  Lumbar spinal stenosis ?  ?EXAM: ?MRI LUMBAR SPINE WITHOUT CONTRAST ?  ?TECHNIQUE: ?Multiplanar, multisequence MR imaging of the lumbar spine was ?performed. No  intravenous contrast was administered. ?  ?COMPARISON:  04/18/2009 ?  ?FINDINGS: ?Segmentation: 5 lumbar type vertebrae based on the available ?coverage ?  ?Alignment:  Degenerative grade 1 anterolisthesis at L4-5, new. ?  ?Vertebrae:  No fracture, evidence of discitis, or bone lesion. ?  ?Conus medullaris and cauda equina: Conus extends to the T12-L1 ?level. Conus is normal. There is cauda equina redundancy due to the ?degree of spinal stenosis. ?  ?Paraspinal and other soft tissues: Negative for perispinal mass or ?inflammation ?  ?Disc levels: ?  ?T12- L1: Mild ventral spurring ?  ?L1-L2: Disc narrowing and bulging greatest into the inferior left ?foramen where there is mild stenosis. Ventral spondylitic spurring. ?  ?L2-L3: Mild far-lateral disc bulging.  Mild facet spurring ?  ?L3-L4: Bilateral inferior foraminal bulging with small facet spurs. ?  ?L4-L5: Advanced facet osteoarthritis with bulky spurring and ?anterolisthesis. The disc is narrowed and bulging with severe spinal ?stenosis. Bilateral L4 impingement with root flattening. ?  ?L5-S1:Degenerative facet spurring ?  ?IMPRESSION: ?Lumbar spine degeneration since 2010, especially advanced at L4-5 ?where there is severe spinal stenosis with bilateral L4 foraminal ?compression. ?  ?  ?Electronically Signed ?  By: Jorje Guild M.D. ?  On: 09/05/2021 11:08 ?  ? ? ? ?Subjective: ?Chief Complaint  ?Patient presents with  ? Lower Back -  Pain  ?  Referred by Dr. Lorin Mercy to discuss L4-5 decompression with Cortical Trajectory screws  ? ? ?51 year old female referred by Dr. Lorin Mercy for evaluation and treatment of L4-5 spondylolisthesis. She is seen today with ongoing complaints of back pain with radiation into both buttocks and thighs and into the legs. Pain is worse with standing and walking and improves with leaning, bending and stooping and leaning of grocery cart when shopping. She has pain that radiates into the lateral thighs and calfs. Unable to stand for  periods greater than 10 min or to walk very far, more than about 200 feet. She is able to walk a mile. Pain is present with prolong sitting as well and there is weakness with prolong standing.  ? ? ?Review of Systems  ?Constitutional: Negative.   ?HENT: Negative.    ?Eyes: Negative.   ?Respiratory: Negative.    ?Cardiovascular: Negative.   ?Gastrointestinal: Negative.   ?Endocrine: Negative.   ?Genitourinary: Negative.   ?Musculoskeletal: Negative.   ?Skin: Negative.   ?Allergic/Immunologic: Negative.   ?Neurological: Negative.   ?Hematological: Negative.   ?Psychiatric/Behavioral: Negative.    ? ? ?Objective: ?Vital Signs: BP 126/90 (BP Location: Left Arm, Patient Position: Sitting)   Pulse 76   Ht '5\' 4"'$  (1.626 m)   Wt 200 lb (90.7 kg)   BMI 34.33 kg/m?  ? ?Physical Exam ?Constitutional:   ?   Appearance: She is well-developed.  ?HENT:  ?   Head: Normocephalic and atraumatic.  ?Eyes:  ?   Pupils: Pupils are equal, round, and reactive to light.  ?Pulmonary:  ?   Effort: Pulmonary effort is normal.  ?   Breath sounds: Normal breath sounds.  ?Abdominal:  ?   General: Bowel sounds are normal.  ?   Palpations: Abdomen is soft.  ?Musculoskeletal:  ?   Cervical back: Normal range of motion and neck supple.  ?   Lumbar back: Negative right straight leg raise test and negative left straight leg raise test.  ?Skin: ?   General: Skin is warm and dry.  ?Neurological:  ?   Mental Status: She is alert and oriented to person, place, and time.  ?Psychiatric:     ?   Behavior: Behavior normal.     ?   Thought Content: Thought content normal.     ?   Judgment: Judgment normal.  ? ? ?Back Exam  ? ?Tenderness  ?The patient is experiencing tenderness in the lumbar. ? ?Range of Motion  ?Extension:  10 abnormal  ?Flexion:  60 abnormal  ?Lateral bend right:  20 abnormal  ?Lateral bend left:  20 abnormal  ?Rotation right:  10 abnormal  ?Rotation left:  10 abnormal  ? ?Muscle Strength  ?Right Quadriceps:  5/5  ?Left Quadriceps:  5/5   ?Right Hamstrings:  5/5  ?Left Hamstrings:  5/5  ? ?Tests  ?Straight leg raise right: negative ?Straight leg raise left: negative ? ?Reflexes  ?Patellar:  0/4 ?Achilles:  0/4 ?Babinski's sign: normal  ? ?Other  ?Toe walk: abnormal ?Heel walk: abnormal ?Sensation: normal ?Gait: antalgic  ? ?Comments:  Weak left EHL 4/5 remaining motor is normal ?Right knee with decreased ROM -10 - 110 degrees, varus deformity with grating of the patellofemoral joint and joint enlargement consistent with medial joint arthrosis with deformity.  ? ? ? ? ?Specialty Comments:  ?No specialty comments available. ? ?Imaging: ?XR Lumb Spine Flex&Ext Only ? ?Result Date: 10/03/2021 ?Spondylolisthesis L4-5 grade 1-2 with nearly 12 mm of  slip with disc narrowing and definite spondylolysis. Arthrosis changes of the facets at this level with disc narrowing of about 70%. Remaining levels without Significant abnormalities.   ? ? ?PMFS History: ?Patient Active Problem List  ? Diagnosis Date Noted  ? Spinal stenosis of lumbar region 09/26/2021  ? Unilateral primary osteoarthritis, right knee 07/31/2021  ? History of anxiety 07/23/2021  ? Essential hypertension 05/01/2021  ? Muscle spasm of back 04/03/2021  ? Fleshy skin mole 04/03/2021  ? History of peripheral neuropathy 01/14/2021  ? Methamphetamine abuse ([place]) 12/24/2020  ? Homelessness 12/24/2020  ? No-show for appointment 06/04/2020  ? History of substance use 05/15/2020  ? Encounter to establish care 01/25/2020  ? Chronic low back pain 01/25/2020  ? Chronic pain of right knee 01/25/2020  ? Depressive disorder 11/13/2019  ? ?Past Medical History:  ?Diagnosis Date  ? Attention deficit disorder (ADD)   ? Depression   ? Hypertension   ? Panic attacks   ?  ?Family History  ?Problem Relation Age of Onset  ? Diabetes Mother   ? Hypertension Mother   ? Cancer Father   ? Lupus Sister   ? Aneurysm Sister   ? SIDS Sister   ?  ?Past Surgical History:  ?Procedure Laterality Date  ? ABDOMINAL HYSTERECTOMY    ?  CHOLECYSTECTOMY    ? ?Social History  ? ?Occupational History  ?  Comment: [employer]  ?Tobacco Use  ? Smoking status: Every Day  ?  Packs/day: 0.25  ?  Years: 25.00  ?  Pack years: 6.25  ?  Types: Cigarettes

## 2021-10-06 ENCOUNTER — Ambulatory Visit: Payer: Medicaid Other | Admitting: Dermatology

## 2021-10-07 ENCOUNTER — Other Ambulatory Visit: Payer: Self-pay

## 2021-10-07 ENCOUNTER — Ambulatory Visit (INDEPENDENT_AMBULATORY_CARE_PROVIDER_SITE_OTHER): Payer: No Typology Code available for payment source | Admitting: Gastroenterology

## 2021-10-07 ENCOUNTER — Encounter: Payer: Self-pay | Admitting: Gastroenterology

## 2021-10-07 VITALS — BP 123/77 | HR 87 | Ht 64.0 in | Wt 205.0 lb

## 2021-10-07 DIAGNOSIS — K5900 Constipation, unspecified: Secondary | ICD-10-CM

## 2021-10-07 DIAGNOSIS — Z1211 Encounter for screening for malignant neoplasm of colon: Secondary | ICD-10-CM

## 2021-10-07 MED ORDER — PEG 3350-KCL-NABCB-NACL-NASULF 236 G PO SOLR
ORAL | 0 refills | Status: AC
  Filled 2021-10-07: qty 4000, 1d supply, fill #0

## 2021-10-07 MED ORDER — NA SULFATE-K SULFATE-MG SULF 17.5-3.13-1.6 GM/177ML PO SOLN
354.0000 mL | Freq: Once | ORAL | 0 refills | Status: DC
  Filled 2021-10-07: qty 354, fill #0

## 2021-10-07 NOTE — Progress Notes (Signed)
?  ?Jonathon Bellows MD, MRCP(U.K) ?[address]  ?[address]  ?[place], [place] [address]  ?Main: [phone]  ?Fax: [phone] ? ? ?Gastroenterology Consultation ? ?Referring Provider:     Langston Reusing, NP ?Primary Care Physician:  Langston Reusing, NP ?Primary Gastroenterologist:  Dr. Jonathon Bellows  ?Reason for Consultation:     Hemorrhoids and bloating ? ? HPI:   ?Sharon Walton is a 52 y.o. y/o female referred for hemorrhoids and bloating. ?She states she is here for screening colonoscopy never had one.  Father had colon polyps.  No change in bowel habits.  No change in the shape of her stool.  No unintentional weight loss.  She also suffers from constipation has a bowel movement every 2 to 3 days.  Hard at times.  Diet is low in fiber.  Not tried any over-the-counter supplements for the same.  Denies any rectal bleeding. ?No recent labs.  ? ? ?Past Medical History:  ?Diagnosis Date  ? Attention deficit disorder (ADD)   ? Depression   ? Hypertension   ? Panic attacks   ? ? ?Past Surgical History:  ?Procedure Laterality Date  ? ABDOMINAL HYSTERECTOMY    ? CHOLECYSTECTOMY    ? ? ?Prior to Admission medications   ?Medication Sig Start Date End Date Taking? Authorizing Provider  ?gabapentin (NEURONTIN) 300 MG capsule Take 1 capsule (300 mg total) by mouth 3 (three) times daily. 09/19/21   Marybelle Killings, MD  ?losartan (COZAAR) 25 MG tablet Take 1 tablet (25 mg total) by mouth once daily. 08/28/21   Iloabachie, Chioma E, NP  ?methocarbamol (ROBAXIN) 500 MG tablet Take 1 tablet (500 mg total) by mouth once every 8 (eight) hours as needed for muscle spasms. 10/03/21   Jessy Oto, MD  ?traMADol (ULTRAM) 50 MG tablet Take 1 tablet (50 mg total) by mouth every 12 (twelve) hours as needed. 09/12/21   Aundra Dubin, PA-C  ? ? ?Family History  ?Problem Relation Age of Onset  ? Diabetes Mother   ? Hypertension Mother   ? Cancer Father   ? Lupus Sister   ? Aneurysm Sister   ? SIDS Sister   ?  ? ?Social  History  ? ?Tobacco Use  ? Smoking status: Every Day  ?  Packs/day: 0.25  ?  Years: 25.00  ?  Pack years: 6.25  ?  Types: Cigarettes  ? Smokeless tobacco: Never  ? Tobacco comments:  ?  Patient smokes 3 cigarettes  per day and vapes. Patient is homeless (since 09/2020) and lives in her car. Patient trying to quit smoking.  ?Vaping Use  ? Vaping Use: Every day  ? Substances: Nicotine, CBD, Flavoring  ?Substance Use Topics  ? Alcohol use: No  ? Drug use: Not Currently  ?  Types: Marijuana, "Crack" cocaine  ?  Comment: used as a teenager  ? ? ?Allergies as of 10/07/2021 - Review Complete 10/03/2021  ?Allergen Reaction Noted  ? Morphine Rash 2006  ? ? ?Review of Systems:    ?All systems reviewed and negative except where noted in HPI. ? ? Physical Exam:  ?There were no vitals taken for this visit. ?No LMP recorded. Patient has had a hysterectomy. ?Psych:  Alert and cooperative. Normal mood and affect. ?General:   Alert,  Well-developed, well-nourished, pleasant and cooperative in NAD ?Head:  Normocephalic and atraumatic. ?Eyes:  Sclera clear, no icterus.   Conjunctiva pink. ?Ears:  Normal auditory acuity.  ?Neurologic:  Alert and  oriented x3;  grossly normal neurologically. ?Psych:  Alert and cooperative. Normal mood and affect. ? ?Imaging Studies: ?XR Lumb Spine Flex&Ext Only ? ?Result Date: 10/03/2021 ?Spondylolisthesis L4-5 grade 1-2 with nearly 12 mm of slip with disc narrowing and definite spondylolysis. Arthrosis changes of the facets at this level with disc narrowing of about 70%. Remaining levels without Significant abnormalities.   ? ?Assessment and Plan:  ? ?Sharon Walton is a 51 y.o. y/o female here to see me for screening colonoscopy also suffers with constipation. ? ?Plan  ?Colonoscopy  ?I suggested a high-fiber diet with an aim of 25 g of fiber per day patient information provided in addition can use 1 capful of MiraLAX daily as needed for constipation if no better at next visit we will plan to  commence on Linzess or Amitiza. ? ?I have discussed alternative options, risks & benefits,  which include, but are not limited to, bleeding, infection, perforation,respiratory complication & drug reaction.  The patient agrees with this plan & written consent will be obtained.   ? ?Follow up in 6-8 weeks  ? ?Dr Jonathon Bellows MD,MRCP(U.K) ? ?

## 2021-10-07 NOTE — Patient Instructions (Signed)
High-Fiber Eating Plan °Fiber, also called dietary fiber, is a type of carbohydrate. It is found foods such as fruits, vegetables, whole grains, and beans. A high-fiber diet can have many health benefits. Your health care provider may recommend a high-fiber diet to help: °Prevent constipation. Fiber can make your bowel movements more regular. °Lower your cholesterol. °Relieve the following conditions: °Inflammation of veins in the anus (hemorrhoids). °Inflammation of specific areas of the digestive tract (uncomplicated diverticulosis). °A problem of the large intestine, also called the colon, that sometimes causes pain and diarrhea (irritable bowel syndrome, or IBS). °Prevent overeating as part of a weight-loss plan. °Prevent heart disease, type 2 diabetes, and certain cancers. °What are tips for following this plan? °Reading food labels ° °Check the nutrition facts label on food products for the amount of dietary fiber. Choose foods that have 5 grams of fiber or more per serving. °The goals for recommended daily fiber intake include: °Men (age 50 or younger): 34-38 g. °Men (over age 50): 28-34 g. °Women (age 50 or younger): 25-28 g. °Women (over age 50): 22-25 g. °Your daily fiber goal is _____________ g. °Shopping °Choose whole fruits and vegetables instead of processed forms, such as apple juice or applesauce. °Choose a wide variety of high-fiber foods such as avocados, lentils, oats, and kidney beans. °Read the nutrition facts label of the foods you choose. Be aware of foods with added fiber. These foods often have high sugar and sodium amounts per serving. °Cooking °Use whole-grain flour for baking and cooking. °Cook with brown rice instead of white rice. °Meal planning °Start the day with a breakfast that is high in fiber, such as a cereal that contains 5 g of fiber or more per serving. °Eat breads and cereals that are made with whole-grain flour instead of refined flour or white flour. °Eat brown rice, bulgur  wheat, or millet instead of white rice. °Use beans in place of meat in soups, salads, and pasta dishes. °Be sure that half of the grains you eat each day are whole grains. °General information °You can get the recommended daily intake of dietary fiber by: °Eating a variety of fruits, vegetables, grains, nuts, and beans. °Taking a fiber supplement if you are not able to take in enough fiber in your diet. It is better to get fiber through food than from a supplement. °Gradually increase how much fiber you consume. If you increase your intake of dietary fiber too quickly, you may have bloating, cramping, or gas. °Drink plenty of water to help you digest fiber. °Choose high-fiber snacks, such as berries, raw vegetables, nuts, and popcorn. °What foods should I eat? °Fruits °Berries. Pears. Apples. Oranges. Avocado. Prunes and raisins. Dried figs. °Vegetables °Sweet potatoes. Spinach. Kale. Artichokes. Cabbage. Broccoli. Cauliflower. Green peas. Carrots. Squash. °Grains °Whole-grain breads. Multigrain cereal. Oats and oatmeal. Brown rice. Barley. Bulgur wheat. Millet. Quinoa. Bran muffins. Popcorn. Rye wafer crackers. °Meats and other proteins °Navy beans, kidney beans, and pinto beans. Soybeans. Split peas. Lentils. Nuts and seeds. °Dairy °Fiber-fortified yogurt. °Beverages °Fiber-fortified soy milk. Fiber-fortified orange juice. °Other foods °Fiber bars. °The items listed above may not be a complete list of recommended foods and beverages. Contact a dietitian for more information. °What foods should I avoid? °Fruits °Fruit juice. Cooked, strained fruit. °Vegetables °Fried potatoes. Canned vegetables. Well-cooked vegetables. °Grains °White bread. Pasta made with refined flour. White rice. °Meats and other proteins °Fatty cuts of meat. Fried chicken or fried fish. °Dairy °Milk. Yogurt. Cream cheese. Sour cream. °Fats and   oils °Butters. °Beverages °Soft drinks. °Other foods °Cakes and pastries. °The items listed above may  not be a complete list of foods and beverages to avoid. Talk with your dietitian about what choices are best for you. °Summary °Fiber is a type of carbohydrate. It is found in foods such as fruits, vegetables, whole grains, and beans. °A high-fiber diet has many benefits. It can help to prevent constipation, lower blood cholesterol, aid weight loss, and reduce your risk of heart disease, diabetes, and certain cancers. °Increase your intake of fiber gradually. Increasing fiber too quickly may cause cramping, bloating, and gas. Drink plenty of water while you increase the amount of fiber you consume. °The best sources of fiber include whole fruits and vegetables, whole grains, nuts, seeds, and beans. °This information is not intended to replace advice given to you by your health care provider. Make sure you discuss any questions you have with your health care provider. °Document Revised: 10/26/2019 Document Reviewed: 10/26/2019 °Elsevier Patient Education © 2022 [employer] Inc[employer] ° °

## 2021-10-07 NOTE — Addendum Note (Signed)
Addended by: Wayna Chalet on: 10/07/2021 04:33 PM ? ? Modules accepted: Orders ? ?

## 2021-10-08 ENCOUNTER — Other Ambulatory Visit: Payer: Self-pay

## 2021-10-09 ENCOUNTER — Ambulatory Visit: Payer: Medicaid Other | Admitting: Gerontology

## 2021-10-15 ENCOUNTER — Ambulatory Visit
Admission: RE | Admit: 2021-10-15 | Payer: No Typology Code available for payment source | Source: Home / Self Care | Admitting: Gastroenterology

## 2021-10-15 ENCOUNTER — Encounter: Payer: Self-pay | Admitting: Certified Registered"

## 2021-10-15 ENCOUNTER — Encounter: Admission: RE | Payer: Self-pay | Source: Home / Self Care

## 2021-10-15 SURGERY — COLONOSCOPY WITH PROPOFOL
Anesthesia: General

## 2021-10-16 ENCOUNTER — Ambulatory Visit: Payer: No Typology Code available for payment source | Admitting: Specialist

## 2021-10-27 ENCOUNTER — Other Ambulatory Visit: Payer: Self-pay | Admitting: Specialist

## 2021-10-27 ENCOUNTER — Other Ambulatory Visit: Payer: Self-pay

## 2021-10-27 ENCOUNTER — Other Ambulatory Visit: Payer: Self-pay | Admitting: Orthopaedic Surgery

## 2021-10-27 DIAGNOSIS — Z8669 Personal history of other diseases of the nervous system and sense organs: Secondary | ICD-10-CM

## 2021-10-27 MED FILL — Gabapentin Cap 300 MG: ORAL | 30 days supply | Qty: 90 | Fill #0 | Status: CN

## 2021-10-27 NOTE — Telephone Encounter (Signed)
Patient called needing Rx refilled Gabapentin and Tramadol. The  number to contact patient is [phone] ?

## 2021-10-28 ENCOUNTER — Other Ambulatory Visit: Payer: Self-pay

## 2021-10-28 MED ORDER — GABAPENTIN 300 MG PO CAPS
300.0000 mg | ORAL_CAPSULE | Freq: Three times a day (TID) | ORAL | 0 refills | Status: DC
  Filled 2021-10-28 (×2): qty 90, 30d supply, fill #0

## 2021-10-28 MED ORDER — TRAMADOL HCL 50 MG PO TABS: 50.0000 mg | ORAL_TABLET | Freq: Two times a day (BID) | ORAL | 0 refills | Status: DC | PRN

## 2021-10-30 ENCOUNTER — Encounter: Payer: Self-pay | Admitting: Specialist

## 2021-10-30 ENCOUNTER — Other Ambulatory Visit: Payer: Self-pay | Admitting: Specialist

## 2021-10-30 ENCOUNTER — Ambulatory Visit (INDEPENDENT_AMBULATORY_CARE_PROVIDER_SITE_OTHER): Payer: Self-pay | Admitting: Specialist

## 2021-10-30 VITALS — BP 137/84 | HR 93 | Ht 64.0 in | Wt 205.0 lb

## 2021-10-30 DIAGNOSIS — M545 Low back pain, unspecified: Secondary | ICD-10-CM

## 2021-10-30 DIAGNOSIS — Z8669 Personal history of other diseases of the nervous system and sense organs: Secondary | ICD-10-CM

## 2021-10-30 DIAGNOSIS — M48062 Spinal stenosis, lumbar region with neurogenic claudication: Secondary | ICD-10-CM

## 2021-10-30 DIAGNOSIS — M4316 Spondylolisthesis, lumbar region: Secondary | ICD-10-CM

## 2021-10-30 DIAGNOSIS — G8929 Other chronic pain: Secondary | ICD-10-CM

## 2021-10-30 NOTE — Patient Instructions (Signed)
Plan: Avoid bending, stooping and avoid lifting weights greater than 10 lbs. Avoid prolong standing and walking. Order for a new walker with wheels. Surgery scheduling secretary Sherri Billings, will call you in the next week to schedule for surgery.  Surgery recommended is a one level lumbar fusion L4-5 this would be done with rods, screws and cages with local bone graft and allograft (donor bone graft). Take hydrocodone for for pain. Risk of surgery includes risk of infection 1 in 200 patients, bleeding 1/2% chance you would need a transfusion.   Risk to the nerves is one in 10,000. You will need to use a brace for 3 months and wean from the brace on the 4th month. Expect improved walking and standing tolerance. Expect relief of leg pain but numbness may persist depending on the length and degree of pressure that has been present.   

## 2021-10-30 NOTE — Progress Notes (Signed)
? ?Office Visit Note ?  ?Patient: Sharon Walton           ?Date of Birth: 01/21/1971           ?MRN: [id number] ?Visit Date: 10/30/2021 ?             ?Requested by: Langston Reusing, NP ?[address] ?[address] ?[place],  [place] [address] ?PCP: Langston Reusing, NP ? ? ?Assessment & Plan: ?Visit Diagnoses:  ?1. Spondylolisthesis, lumbar region   ?2. Spinal stenosis of lumbar region with neurogenic claudication   ?3. History of peripheral neuropathy   ?4. Chronic bilateral low back pain, unspecified whether sciatica present   ? ? ?Plan: Plan: Avoid bending, stooping and avoid lifting weights greater than 10 lbs. ?Avoid prolong standing and walking. ?Order for a new walker with wheels. ?Surgery scheduling secretary Kandice Hams, will call you in the next week to schedule for surgery.  ?Surgery recommended is a one level lumbar fusion L4-5 this would be done with rods, screws and cages with local bone graft and allograft (donor bone graft). ?Take hydrocodone for for pain. ?Risk of surgery includes risk of infection 1 in 200 patients, bleeding 1/2% chance you would need a transfusion.   Risk to the nerves is one in 10,000. ?You will need to use a brace for 3 months and wean from the brace on the 4th month. ?Expect improved walking and standing tolerance. Expect relief of leg pain but numbness ?may persist depending on the length and degree of pressure that has been present ? ?Follow-Up Instructions: No follow-ups on file.  ? ?Orders:  ?No orders of the defined types were placed in this encounter. ? ?No orders of the defined types were placed in this encounter. ? ? ? ? Procedures: ?No procedures performed ? ? ?Clinical Data: ?Findings:  ?Narrative & Impression ?CLINICAL DATA:  Lumbar spinal stenosis ?  ?EXAM: ?MRI LUMBAR SPINE WITHOUT CONTRAST ?  ?TECHNIQUE: ?Multiplanar, multisequence MR imaging of the lumbar spine was ?performed. No intravenous contrast was administered. ?  ?COMPARISON:  04/18/2009 ?   ?FINDINGS: ?Segmentation: 5 lumbar type vertebrae based on the available ?coverage ?  ?Alignment:  Degenerative grade 1 anterolisthesis at L4-5, new. ?  ?Vertebrae:  No fracture, evidence of discitis, or bone lesion. ?  ?Conus medullaris and cauda equina: Conus extends to the T12-L1 ?level. Conus is normal. There is cauda equina redundancy due to the ?degree of spinal stenosis. ?  ?Paraspinal and other soft tissues: Negative for perispinal mass or ?inflammation ?  ?Disc levels: ?  ?T12- L1: Mild ventral spurring ?  ?L1-L2: Disc narrowing and bulging greatest into the inferior left ?foramen where there is mild stenosis. Ventral spondylitic spurring. ?  ?L2-L3: Mild far-lateral disc bulging.  Mild facet spurring ?  ?L3-L4: Bilateral inferior foraminal bulging with small facet spurs. ?  ?L4-L5: Advanced facet osteoarthritis with bulky spurring and ?anterolisthesis. The disc is narrowed and bulging with severe spinal ?stenosis. Bilateral L4 impingement with root flattening. ?  ?L5-S1:Degenerative facet spurring ?  ?IMPRESSION: ?Lumbar spine degeneration since 2010, especially advanced at L4-5 ?where there is severe spinal stenosis with bilateral L4 foraminal ?compression. ?  ?  ?Electronically Signed ?  By: Jorje Guild M.D. ?  On: 09/05/2021 11:08 ?  ? ? ? ? ?Subjective: ?Chief Complaint  ?Patient presents with  ? Lower Back - Follow-up  ?  She is scheduled for surgery 11/11/21 Left L4-5 TLIF  ? ? ?51 year old female with history  of low back and bilateral leg pain and numbness and tingling. Pain is worse with standing and walking. ?At time difficulty walking in from the parking lot. She has a spondylolisthesis and is on the schedule to undergo an L4-5 TLIF, the pain is present on  ?Both sides and it occurs at the same time. She has no bowel or bladder difficulty.  ? ? ?Review of Systems  ?Constitutional: Negative.   ?HENT: Negative.    ?Eyes: Negative.   ?Respiratory: Negative.    ?Cardiovascular: Negative.    ?Gastrointestinal: Negative.   ?Endocrine: Negative.   ?Genitourinary: Negative.   ?Musculoskeletal: Negative.   ?Skin: Negative.   ?Allergic/Immunologic: Negative.   ?Neurological: Negative.   ?Hematological: Negative.   ?Psychiatric/Behavioral: Negative.    ? ? ?Objective: ?Vital Signs: BP 137/84 (BP Location: Left Arm, Patient Position: Sitting)   Pulse 93   Ht '5\' 4"'$  (1.626 m)   Wt 205 lb (93 kg)   BMI 35.19 kg/m?  ? ?Physical Exam ?Constitutional:   ?   Appearance: She is well-developed.  ?HENT:  ?   Head: Normocephalic and atraumatic.  ?Eyes:  ?   Pupils: Pupils are equal, round, and reactive to light.  ?Pulmonary:  ?   Effort: Pulmonary effort is normal.  ?   Breath sounds: Normal breath sounds.  ?Abdominal:  ?   General: Bowel sounds are normal.  ?   Palpations: Abdomen is soft.  ?Musculoskeletal:  ?   Cervical back: Normal range of motion and neck supple.  ?   Lumbar back: Negative right straight leg raise test and negative left straight leg raise test.  ?Skin: ?   General: Skin is warm and dry.  ?Neurological:  ?   Mental Status: She is alert and oriented to person, place, and time.  ?Psychiatric:     ?   Behavior: Behavior normal.     ?   Thought Content: Thought content normal.     ?   Judgment: Judgment normal.  ? ? ?Back Exam  ? ?Tenderness  ?The patient is experiencing tenderness in the lumbar. ? ?Range of Motion  ?Extension:  abnormal  ?Flexion:  abnormal  ?Lateral bend right:  abnormal  ?Lateral bend left:  abnormal  ?Rotation right:  abnormal  ?Rotation left:  abnormal  ? ?Muscle Strength  ?Right Quadriceps:  5/5  ?Left Quadriceps:  5/5  ?Right Hamstrings:  5/5  ?Left Hamstrings:  5/5  ? ?Tests  ?Straight leg raise right: negative ?Straight leg raise left: negative ? ?Reflexes  ?Patellar:  2/4 ?Achilles:  2/4 ? ?Other  ?Toe walk: normal ?Heel walk: normal ? ?Comments:  Motor is intact. ?SLR is negative.  ? ? ? ? ?Specialty Comments:  ?No specialty comments available. ? ?Imaging: ?No results  found. ? ? ?PMFS History: ?Patient Active Problem List  ? Diagnosis Date Noted  ? Spinal stenosis of lumbar region 09/26/2021  ? Unilateral primary osteoarthritis, right knee 07/31/2021  ? History of anxiety 07/23/2021  ? Essential hypertension 05/01/2021  ? Muscle spasm of back 04/03/2021  ? Fleshy skin mole 04/03/2021  ? History of peripheral neuropathy 01/14/2021  ? Methamphetamine abuse ([place]) 12/24/2020  ? Homelessness 12/24/2020  ? No-show for appointment 06/04/2020  ? History of substance use 05/15/2020  ? Encounter to establish care 01/25/2020  ? Chronic low back pain 01/25/2020  ? Chronic pain of right knee 01/25/2020  ? Depressive disorder 11/13/2019  ? ?Past Medical History:  ?Diagnosis Date  ? Attention deficit disorder (ADD)   ?  Depression   ? Hypertension   ? Panic attacks   ?  ?Family History  ?Problem Relation Age of Onset  ? Diabetes Mother   ? Hypertension Mother   ? Cancer Father   ? Lupus Sister   ? Aneurysm Sister   ? SIDS Sister   ?  ?Past Surgical History:  ?Procedure Laterality Date  ? ABDOMINAL HYSTERECTOMY    ? CHOLECYSTECTOMY    ? ?Social History  ? ?Occupational History  ?  Comment: [employer]  ?Tobacco Use  ? Smoking status: Every Day  ?  Packs/day: 0.25  ?  Years: 25.00  ?  Pack years: 6.25  ?  Types: Cigarettes  ? Smokeless tobacco: Never  ? Tobacco comments:  ?  Patient smokes 3 cigarettes  per day and vapes. Patient is homeless (since 09/2020) and lives in her car. Patient trying to quit smoking.  ?Vaping Use  ? Vaping Use: Every day  ? Substances: Nicotine, CBD, Flavoring  ?Substance and Sexual Activity  ? Alcohol use: No  ? Drug use: Not Currently  ?  Types: Marijuana, "Crack" cocaine  ?  Comment: used as a teenager  ? Sexual activity: Yes  ?  Partners: Male  ?  Birth control/protection: Surgical  ?  Comment: Hysterectomy  ? ? ? ? ? ? ?

## 2021-10-31 ENCOUNTER — Other Ambulatory Visit: Payer: Self-pay | Admitting: Physician Assistant

## 2021-11-03 ENCOUNTER — Other Ambulatory Visit: Payer: Self-pay | Admitting: Specialist

## 2021-11-03 MED ORDER — TRAMADOL HCL 50 MG PO TABS: 50.0000 mg | ORAL_TABLET | Freq: Four times a day (QID) | ORAL | 0 refills | Status: DC | PRN

## 2021-11-03 NOTE — Telephone Encounter (Signed)
Please send in Tramadol to [facility] on [address]  ?

## 2021-11-05 ENCOUNTER — Other Ambulatory Visit: Payer: Self-pay

## 2021-11-06 ENCOUNTER — Ambulatory Visit (INDEPENDENT_AMBULATORY_CARE_PROVIDER_SITE_OTHER): Payer: Medicaid Other | Admitting: Surgery

## 2021-11-06 ENCOUNTER — Encounter: Payer: Self-pay | Admitting: Surgery

## 2021-11-06 VITALS — BP 137/84 | HR 80 | Ht 64.0 in | Wt 205.0 lb

## 2021-11-06 DIAGNOSIS — M4316 Spondylolisthesis, lumbar region: Secondary | ICD-10-CM

## 2021-11-06 DIAGNOSIS — M48062 Spinal stenosis, lumbar region with neurogenic claudication: Secondary | ICD-10-CM

## 2021-11-06 NOTE — Progress Notes (Signed)
51 year old white female with history of L4-5 HEP/stenosis comes in for preop evaluation.  States that symptoms unchanged from previous visit and she is wanting to proceed with LEFT L4-5 TRANSFORAMINAL LUMBAR INTERBODY FUSION WITH PEDICLE SCREWS, RODS, CAGE, LOCAL BONE GRAFT, ALLOGRAFT BONE GRAFT, VIVIGEN.  Today history and physical performed.  Review of systems negative.  Surgical procedure discussed along with potential hospital stay.  All questions answered. ?

## 2021-11-07 ENCOUNTER — Other Ambulatory Visit: Payer: Self-pay

## 2021-11-10 ENCOUNTER — Ambulatory Visit (HOSPITAL_COMMUNITY): Payer: Self-pay | Admitting: Certified Registered"

## 2021-11-10 ENCOUNTER — Other Ambulatory Visit: Payer: Self-pay

## 2021-11-10 ENCOUNTER — Encounter (HOSPITAL_COMMUNITY): Payer: Self-pay | Admitting: Specialist

## 2021-11-10 ENCOUNTER — Telehealth: Payer: Self-pay | Admitting: Pharmacy Technician

## 2021-11-10 NOTE — Progress Notes (Signed)
Toniann Dickerson denies chest pain or shortness of breath. Patient denies having any s/s of Covid in her household.  Patient denies any known exposure to Covid.  ? ?Ms Hickok's PCP is Chioma E Iloabachie. ?Ms Lehrmann denies having a cardiologist. ? ?Ms. Sadiq denies using any illicit drugs. Patient continues to smoke and vape, I instructed Ms Fargo to not smoke anything else before surgery. ? ?I instructed Ms Ruegg  to shower with antibacteria soap.  DO not shave. No nail polish, artificial or acrylic nails. Wear clean clothes, brush your teeth. ?Glasses, contact lens,dentures or partials may not be worn in the OR. If you need to wear them, please bring a case for glasses, do not wear contacts or bring a case, the hospital does not have contact cases, dentures or partials will have to be removed , make sure they are clean, we will provide a denture cup to put them in. You will need some one to drive you home and a responsible person over the age of 58 to stay with you for the first 24 hours after surgery.  ?

## 2021-11-10 NOTE — Telephone Encounter (Signed)
Received updated proof of income.  Patient eligible to receive medication assistance at [facility] until time for re-certification in 2024, and as long as eligibility requirements continue to be met. ? ?Sharon Walton Sharon Walton ?Care Manager ?[facility]  ?

## 2021-11-11 ENCOUNTER — Encounter (HOSPITAL_COMMUNITY): Payer: Self-pay | Admitting: Specialist

## 2021-11-11 ENCOUNTER — Ambulatory Visit (HOSPITAL_COMMUNITY): Payer: Self-pay

## 2021-11-11 ENCOUNTER — Ambulatory Visit (HOSPITAL_COMMUNITY)
Admission: RE | Admit: 2021-11-11 | Discharge: 2021-11-11 | Disposition: A | Discharge status: Home / Self Care | Payer: Self-pay | Attending: Specialist | Admitting: Specialist

## 2021-11-11 ENCOUNTER — Encounter (HOSPITAL_COMMUNITY)
Admission: RE | Disposition: A | Discharge status: Home / Self Care | Payer: Self-pay | Source: Home / Self Care | Attending: Specialist

## 2021-11-11 ENCOUNTER — Other Ambulatory Visit: Payer: Self-pay | Admitting: Specialist

## 2021-11-11 DIAGNOSIS — Z01818 Encounter for other preprocedural examination: Secondary | ICD-10-CM

## 2021-11-11 DIAGNOSIS — M48061 Spinal stenosis, lumbar region without neurogenic claudication: Secondary | ICD-10-CM | POA: Insufficient documentation

## 2021-11-11 DIAGNOSIS — F1590 Other stimulant use, unspecified, uncomplicated: Secondary | ICD-10-CM | POA: Insufficient documentation

## 2021-11-11 DIAGNOSIS — M4316 Spondylolisthesis, lumbar region: Secondary | ICD-10-CM | POA: Diagnosis present

## 2021-11-11 DIAGNOSIS — Z5309 Procedure and treatment not carried out because of other contraindication: Secondary | ICD-10-CM | POA: Insufficient documentation

## 2021-11-11 DIAGNOSIS — F1520 Other stimulant dependence, uncomplicated: Secondary | ICD-10-CM

## 2021-11-11 DIAGNOSIS — F1310 Sedative, hypnotic or anxiolytic abuse, uncomplicated: Secondary | ICD-10-CM | POA: Diagnosis present

## 2021-11-11 DIAGNOSIS — M5116 Intervertebral disc disorders with radiculopathy, lumbar region: Secondary | ICD-10-CM | POA: Insufficient documentation

## 2021-11-11 HISTORY — DX: Unspecified osteoarthritis, unspecified site: M19.90

## 2021-11-11 HISTORY — DX: Bipolar disorder, unspecified: F31.9

## 2021-11-11 LAB — CBC
HCT: 34.1 % — ABNORMAL LOW (ref 36.0–46.0)
Hemoglobin: 10.9 g/dL — ABNORMAL LOW (ref 12.0–15.0)
MCH: 27.3 pg (ref 26.0–34.0)
MCHC: 32 g/dL (ref 30.0–36.0)
MCV: 85.3 fL (ref 80.0–100.0)
Platelets: 328 10*3/uL (ref 150–400)
RBC: 4 MIL/uL (ref 3.87–5.11)
RDW: 12.9 % (ref 11.5–15.5)
WBC: 11 10*3/uL — ABNORMAL HIGH (ref 4.0–10.5)
nRBC: 0 % (ref 0.0–0.2)

## 2021-11-11 LAB — COMPREHENSIVE METABOLIC PANEL
ALT: 9 U/L (ref 0–44)
AST: 15 U/L (ref 15–41)
Albumin: 3.1 g/dL — ABNORMAL LOW (ref 3.5–5.0)
Alkaline Phosphatase: 77 U/L (ref 38–126)
Anion gap: 7 (ref 5–15)
BUN: 5 mg/dL — ABNORMAL LOW (ref 6–20)
CO2: 28 mmol/L (ref 22–32)
Calcium: 8.9 mg/dL (ref 8.9–10.3)
Chloride: 104 mmol/L (ref 98–111)
Creatinine, Ser: 0.77 mg/dL (ref 0.44–1.00)
GFR, Estimated: >60 mL/min (ref >60)
Glucose, Bld: 109 mg/dL — ABNORMAL HIGH (ref 70–99)
Potassium: 4.2 mmol/L (ref 3.5–5.1)
Sodium: 139 mmol/L (ref 135–145)
Total Bilirubin: 0.5 mg/dL (ref 0.3–1.2)
Total Protein: 6.2 g/dL — ABNORMAL LOW (ref 6.5–8.1)

## 2021-11-11 LAB — TYPE AND SCREEN
ABO/RH(D): A POS
Antibody Screen: NEGATIVE

## 2021-11-11 LAB — RAPID URINE DRUG SCREEN, HOSP PERFORMED
Amphetamines: POSITIVE — AB
Barbiturates: NOT DETECTED
Benzodiazepines: POSITIVE — AB
Cocaine: NOT DETECTED
Opiates: NOT DETECTED
Tetrahydrocannabinol: NOT DETECTED

## 2021-11-11 LAB — ABO/RH: ABO/RH(D): A POS

## 2021-11-11 LAB — SURGICAL PCR SCREEN
MRSA, PCR: POSITIVE — AB
Staphylococcus aureus: POSITIVE — AB

## 2021-11-11 SURGERY — POSTERIOR LUMBAR FUSION 1 LEVEL
Anesthesia: General | Site: Spine Lumbar

## 2021-11-11 MED ORDER — BUPIVACAINE HCL (PF) 0.5 % IJ SOLN
INTRAMUSCULAR | Status: AC
  Filled 2021-11-11: qty 30

## 2021-11-11 MED ORDER — LACTATED RINGERS IV SOLN: INTRAVENOUS | Status: DC

## 2021-11-11 MED ORDER — SUFENTANIL CITRATE 50 MCG/ML IV SOLN
INTRAVENOUS | Status: AC
  Filled 2021-11-11: qty 1

## 2021-11-11 MED ORDER — ROCURONIUM BROMIDE 10 MG/ML (PF) SYRINGE
PREFILLED_SYRINGE | INTRAVENOUS | Status: AC
  Filled 2021-11-11: qty 10

## 2021-11-11 MED ORDER — CHLORHEXIDINE GLUCONATE 0.12 % MT SOLN
15.0000 mL | Freq: Once | OROMUCOSAL | Status: AC
  Administered 2021-11-11: 15 mL via OROMUCOSAL
  Filled 2021-11-11: qty 15

## 2021-11-11 MED ORDER — CEFAZOLIN SODIUM-DEXTROSE 2-4 GM/100ML-% IV SOLN
INTRAVENOUS | Status: AC
  Filled 2021-11-11: qty 100

## 2021-11-11 MED ORDER — LIDOCAINE 2% (20 MG/ML) 5 ML SYRINGE
INTRAMUSCULAR | Status: AC
  Filled 2021-11-11: qty 5

## 2021-11-11 MED ORDER — BUPIVACAINE LIPOSOME 1.3 % IJ SUSP
10.0000 mL | Freq: Once | INTRAMUSCULAR | Status: DC
  Filled 2021-11-11: qty 10

## 2021-11-11 MED ORDER — SODIUM CHLORIDE (PF) 0.9 % IJ SOLN
INTRAMUSCULAR | Status: AC
  Filled 2021-11-11: qty 10

## 2021-11-11 MED ORDER — MIDAZOLAM HCL 2 MG/2ML IJ SOLN
INTRAMUSCULAR | Status: AC
  Filled 2021-11-11: qty 2

## 2021-11-11 MED ORDER — PROPOFOL 10 MG/ML IV BOLUS
INTRAVENOUS | Status: AC
  Filled 2021-11-11: qty 20

## 2021-11-11 MED ORDER — CEFAZOLIN SODIUM-DEXTROSE 2-4 GM/100ML-% IV SOLN: 2.0000 g | INTRAVENOUS | Status: DC

## 2021-11-11 MED ORDER — ORAL CARE MOUTH RINSE: 15.0000 mL | Freq: Once | OROMUCOSAL | Status: AC

## 2021-11-11 MED ORDER — BUPIVACAINE LIPOSOME 1.3 % IJ SUSP
INTRAMUSCULAR | Status: AC
  Filled 2021-11-11: qty 20

## 2021-11-11 NOTE — H&P (Signed)
Sharon Walton is an 51 y.o. female.   ?Chief Complaint: back pain and LE radiculopathy ?HPI: 51 year old white female with history of L4-5 HEP/stenosis comes in for preop evaluation.  States that symptoms unchanged from previous visit and she is wanting to proceed with LEFT L4-5 TRANSFORAMINAL LUMBAR INTERBODY FUSION WITH PEDICLE SCREWS, RODS, CAGE, LOCAL BONE GRAFT, ALLOGRAFT BONE GRAFT, VIVIGEN.  Today history and physical performed.  Review of systems negative. ? ?Past Medical History:  ?Diagnosis Date  ? Arthritis   ? Attention deficit disorder (ADD)   ? Bipolar disorder ([place])   ? Depression   ? Hypertension   ? Panic attacks   ? ? ?Past Surgical History:  ?Procedure Laterality Date  ? ABDOMINAL HYSTERECTOMY    ? CHOLECYSTECTOMY    ? ? ?Family History  ?Problem Relation Age of Onset  ? Diabetes Mother   ? Hypertension Mother   ? Cancer Father   ? Lupus Sister   ? Aneurysm Sister   ? SIDS Sister   ? ?Social History:  reports that she has been smoking cigarettes. She has a 3.25 pack-year smoking history. She has never used smokeless tobacco. She reports that she does not currently use drugs after having used the following drugs: Marijuana and "Crack" cocaine. She reports that she does not drink alcohol. ? ?Allergies:  ?Allergies  ?Allergen Reactions  ? Morphine Rash  ?  urticaria  ? ? ?Medications Prior to Admission  ?Medication Sig Dispense Refill  ? gabapentin (NEURONTIN) 300 MG capsule Take 1 capsule (300 mg total) by mouth 3 (three) times daily. 90 capsule 0  ? losartan (COZAAR) 25 MG tablet Take 1 tablet (25 mg total) by mouth once daily. 30 tablet 2  ? Menthol (ICY HOT) 5 % PTCH Apply 1 application. topically daily as needed (Back pain).    ? Menthol, Topical Analgesic, (BIOFREEZE) 4 % GEL Apply 1 application. topically daily as needed (back pain).    ? methocarbamol (ROBAXIN) 500 MG tablet Take 1 tablet (500 mg total) by mouth once every 8 (eight) hours as needed for muscle spasms. 30 tablet 1  ?  OMEPRAZOLE PO Take 1 capsule by mouth 2 (two) times daily as needed (Heartburn).    ? traMADol (ULTRAM) 50 MG tablet Take 1 tablet (50 mg total) by mouth every 6 (six) hours as needed. 30 tablet 0  ? polyethylene glycol (GOLYTELY) 236 g solution Prepare according to package instructions. Starting at 5:00 PM: Drink one 8 oz glass of mixture every 15 minutes until you finish half of the jug. 5 hours prior to procedure, drink 8 oz glass of mixture every 15 minutes until all gone. Make sure you do not drink anything 4 hours prior to your procedure. 4000 mL 0  ? ? ?Results for orders placed or performed during the hospital encounter of 11/11/21 (from the past 48 hour(s))  ?Type and screen [place]     Status: None (Preliminary result)  ? Collection Time: 11/11/21  6:11 AM  ?Result Value Ref Range  ? ABO/RH(D) PENDING   ? Antibody Screen PENDING   ? Sample Expiration    ?  11/14/2021,2359 ?Performed at [facility], [place] [address] [place], [place] [address] ?  ?CBC per protocol     Status: Abnormal  ? Collection Time: 11/11/21  6:23 AM  ?Result Value Ref Range  ? WBC 11.0 (H) 4.0 - 10.5 K/uL  ? RBC 4.00 3.87 - 5.11 MIL/uL  ? Hemoglobin 10.9 (  L) 12.0 - 15.0 g/dL  ? HCT 34.1 (L) 36.0 - 46.0 %  ? MCV 85.3 80.0 - 100.0 fL  ? MCH 27.3 26.0 - 34.0 pg  ? MCHC 32.0 30.0 - 36.0 g/dL  ? RDW 12.9 11.5 - 15.5 %  ? Platelets 328 150 - 400 K/uL  ? nRBC 0.0 0.0 - 0.2 %  ?  Comment: Performed at [facility], [place] [address], [place], [place] [address]  ? ?No results found. ? ?Review of Systems  ?Constitutional:  Positive for activity change.  ?HENT: Negative.    ?Respiratory: Negative.    ?Gastrointestinal: Negative.   ?Genitourinary: Negative.   ?Musculoskeletal:  Positive for back pain and gait problem.  ?Neurological:  Positive for numbness.  ? ?Blood pressure 136/71, pulse 88, temperature 98.1 ?F (36.7 ?C), temperature source Oral, resp. rate 18, height '5\' 3"'$  (1.6 m), weight 90.7 kg, SpO2 98  %. ?Physical Exam ?HENT:  ?   Head: Normocephalic and atraumatic.  ?   Nose: Nose normal.  ?Eyes:  ?   Extraocular Movements: Extraocular movements intact.  ?Cardiovascular:  ?   Rate and Rhythm: Regular rhythm.  ?   Heart sounds: No murmur heard. ?Pulmonary:  ?   Effort: No respiratory distress.  ?   Breath sounds: Normal breath sounds.  ?Abdominal:  ?   General: Bowel sounds are normal. There is no distension.  ?   Tenderness: There is no abdominal tenderness.  ?Neurological:  ?   Mental Status: She is alert and oriented to person, place, and time.  ?  ? ?Assessment/Plan ?L4-5 HNP/stenosis ? ?We will proceed with LEFT L4-5 TRANSFORAMINAL LUMBAR INTERBODY FUSION WITH PEDICLE SCREWS, RODS, CAGE, LOCAL BONE GRAFT, ALLOGRAFT BONE GRAFT, VIVIGEN as scheduled.  All questions answered ? ?Benjiman Core, PA-C ?11/11/2021, 6:49 AM ? ? ? ?

## 2021-11-11 NOTE — Progress Notes (Signed)
Surgery was canceled and patient was discharged per MD verbal order. IV line was D/C. Patient alert and oriented x 4, verbalized that her son is waiting for her in the waiting room. Patient refused to be accompanied to her son by a staff member.  ?

## 2021-11-11 NOTE — Progress Notes (Signed)
? ? ? ?  Subjective: ?Day of Surgery Procedure(s) (LRB): ?LEFT L4-5 TRANSFORAMINAL LUMBAR INTERBODY FUSION WITH PEDICLE SCREWS, RODS, CAGE, LOCAL BONE GRAFT, ALLOGRAFT BONE GRAFT, VIVIGEN (N/A) ?Awake, alert and oriented x 4. She is scheduled for surgery this AM but the results of a rapid drug screen show that she is taking amphetamines and likely benzodiazepines.  She is open about the use of adderall that she takes that a "friend" prescribes and it helps her to feel good. She is not certain about how the screen is positive for benzodiazepines. ? ?Patient reports pain as mild.   ? ?Objective:  ? ?VITALS:  Temp:  [98.1 ?F (36.7 ?C)] 98.1 ?F (36.7 ?C) (05/09 0601) ?Pulse Rate:  [88] 88 (05/09 0601) ?Resp:  [18] 18 (05/09 0601) ?BP: (136)/(71) 136/71 (05/09 0601) ?SpO2:  [98 %] 98 % (05/09 0601) ?Weight:  [90.7 kg] 90.7 kg (05/09 0601) ? ?Neurologically intact ?ABD soft ?Neurovascular intact ?Sensation intact distally ?Intact pulses distally ?Dorsiflexion/Plantar flexion intact ? ? ?LABS ?Recent Labs  ?  11/11/21 ?0623  ?HGB 10.9*  ?WBC 11.0*  ?PLT 328  ? ?Recent Labs  ?  11/11/21 ?0623  ?NA 139  ?K 4.2  ?CL 104  ?CO2 28  ?BUN 5*  ?CREATININE 0.77  ?GLUCOSE 109*  ? ?No results for input(s): LABPT, INR in the last 72 hours. ? ? ?Assessment/Plan: ?Day of Surgery Procedure(s) (LRB): ?LEFT L4-5 TRANSFORAMINAL LUMBAR INTERBODY FUSION WITH PEDICLE SCREWS, RODS, CAGE, LOCAL BONE GRAFT, ALLOGRAFT BONE GRAFT, VIVIGEN (N/A) ?Surgery is cancelled by Dr. Ermalene Postin anesthesia due to uncertain use of amphetamines and benzodiazepines.  ?Amphetamine use and serologic positive toxicology screen for benzodiazepine use.  ? ?Advance diet ?Discharge home and will retest in the next several weeks to determine if she can be rescheduled for intervention.  ?I told this patient to contact her primary care about the use of these substances and that ?Stopping them may result in withdrawal. ? ?Basil Dess ?11/11/2021, 8:19 AM  ?

## 2021-11-11 NOTE — Interval H&P Note (Signed)
History and Physical Interval Note: ? ?11/11/2021 ?7:41 AM ? ?Sharon Walton  has presented today for surgery, with the diagnosis of painful spondylolisthesis L4-5 with severe spinal stenosis.  The various methods of treatment have been discussed with the patient and family. After consideration of risks, benefits and other options for treatment, the patient has consented to  Procedure(s): ?LEFT L4-5 TRANSFORAMINAL LUMBAR INTERBODY FUSION WITH PEDICLE SCREWS, RODS, CAGE, LOCAL BONE GRAFT, ALLOGRAFT BONE GRAFT, VIVIGEN (N/A) as a surgical intervention.  The patient's history has been reviewed, patient examined, no change in status, stable for surgery.  I have reviewed the patient's chart and labs.  Questions were answered to the patient's satisfaction.   ? ? ?Basil Dess ? ? ?

## 2021-11-12 ENCOUNTER — Other Ambulatory Visit: Payer: Self-pay

## 2021-11-28 ENCOUNTER — Ambulatory Visit (INDEPENDENT_AMBULATORY_CARE_PROVIDER_SITE_OTHER): Payer: Self-pay | Admitting: Specialist

## 2021-11-28 DIAGNOSIS — G8929 Other chronic pain: Secondary | ICD-10-CM

## 2021-11-28 DIAGNOSIS — M545 Low back pain, unspecified: Secondary | ICD-10-CM

## 2021-12-02 ENCOUNTER — Other Ambulatory Visit: Payer: Self-pay | Admitting: Specialist

## 2021-12-02 ENCOUNTER — Other Ambulatory Visit: Payer: Self-pay

## 2021-12-02 ENCOUNTER — Other Ambulatory Visit: Payer: Self-pay | Admitting: Gerontology

## 2021-12-02 DIAGNOSIS — I10 Essential (primary) hypertension: Secondary | ICD-10-CM

## 2021-12-02 MED ORDER — LOSARTAN POTASSIUM 25 MG PO TABS
25.0000 mg | ORAL_TABLET | Freq: Every day | ORAL | 2 refills | Status: AC
  Filled 2021-12-02 – 2022-01-01 (×2): qty 30, 30d supply, fill #0
  Filled 2022-02-05: qty 30, 30d supply, fill #1

## 2021-12-02 MED ORDER — GABAPENTIN 300 MG PO CAPS
300.0000 mg | ORAL_CAPSULE | Freq: Three times a day (TID) | ORAL | 0 refills | Status: DC
  Filled 2021-12-02: qty 90, 30d supply, fill #0

## 2021-12-03 ENCOUNTER — Other Ambulatory Visit: Payer: Self-pay | Admitting: Specialist

## 2021-12-03 ENCOUNTER — Other Ambulatory Visit: Payer: Self-pay

## 2021-12-10 NOTE — Patient Instructions (Signed)
v

## 2021-12-30 ENCOUNTER — Other Ambulatory Visit: Payer: Self-pay

## 2021-12-30 ENCOUNTER — Other Ambulatory Visit: Payer: Self-pay | Admitting: Specialist

## 2021-12-30 MED ORDER — GABAPENTIN 300 MG PO CAPS
300.0000 mg | ORAL_CAPSULE | Freq: Three times a day (TID) | ORAL | 0 refills | Status: DC
Start: 2021-12-30 — End: 2022-02-05
  Filled 2021-12-30: qty 90, 30d supply, fill #0

## 2022-01-01 ENCOUNTER — Other Ambulatory Visit: Payer: Self-pay

## 2022-01-01 ENCOUNTER — Other Ambulatory Visit: Payer: Self-pay | Admitting: Specialist

## 2022-01-01 MED ORDER — METHOCARBAMOL 500 MG PO TABS
500.0000 mg | ORAL_TABLET | Freq: Three times a day (TID) | ORAL | 1 refills | Status: AC | PRN
  Filled 2022-01-01: qty 30, 10d supply, fill #0

## 2022-01-05 ENCOUNTER — Other Ambulatory Visit: Payer: Self-pay | Admitting: Pharmacy Technician

## 2022-01-05 NOTE — Progress Notes (Signed)
Patient has prescription coverage with [employer].  Sharon Walton Patient Advocate Specialist [place]

## 2022-01-16 ENCOUNTER — Other Ambulatory Visit: Payer: Self-pay | Admitting: Specialist

## 2022-01-16 ENCOUNTER — Other Ambulatory Visit: Payer: Self-pay

## 2022-01-21 ENCOUNTER — Other Ambulatory Visit: Payer: Self-pay

## 2022-01-22 ENCOUNTER — Ambulatory Visit: Payer: Commercial Managed Care - HMO | Admitting: Gerontology

## 2022-02-05 ENCOUNTER — Other Ambulatory Visit: Payer: Self-pay | Admitting: Specialist

## 2022-02-05 ENCOUNTER — Other Ambulatory Visit: Payer: Self-pay

## 2022-02-05 ENCOUNTER — Encounter (INDEPENDENT_AMBULATORY_CARE_PROVIDER_SITE_OTHER): Payer: Self-pay

## 2022-02-05 MED FILL — Gabapentin Cap 300 MG: ORAL | 30 days supply | Qty: 90 | Fill #0 | Status: CN

## 2022-02-06 ENCOUNTER — Other Ambulatory Visit: Payer: Self-pay

## 2022-02-13 ENCOUNTER — Other Ambulatory Visit: Payer: Self-pay

## 2022-02-23 ENCOUNTER — Ambulatory Visit: Payer: No Typology Code available for payment source | Admitting: Gastroenterology

## 2022-02-26 ENCOUNTER — Other Ambulatory Visit: Payer: Self-pay

## 2022-03-31 ENCOUNTER — Encounter: Payer: Self-pay | Admitting: Specialist

## 2022-03-31 NOTE — Progress Notes (Signed)
Patient no show for her appointment. She was not seen or examined.

## 2022-08-21 ENCOUNTER — Other Ambulatory Visit: Payer: Self-pay

## 2022-11-11 ENCOUNTER — Ambulatory Visit: Payer: Medicaid Other | Admitting: Orthopedic Surgery

## 2022-11-23 ENCOUNTER — Other Ambulatory Visit: Payer: Self-pay

## 2022-12-02 ENCOUNTER — Ambulatory Visit: Payer: Medicaid Other

## 2023-05-05 ENCOUNTER — Ambulatory Visit
Admission: EM | Admit: 2023-05-05 | Discharge: 2023-05-05 | Disposition: A | Discharge status: Home / Self Care | Payer: Medicaid Other | Attending: Emergency Medicine | Admitting: Emergency Medicine

## 2023-05-05 DIAGNOSIS — L02413 Cutaneous abscess of right upper limb: Secondary | ICD-10-CM

## 2023-05-05 DIAGNOSIS — M25561 Pain in right knee: Secondary | ICD-10-CM | POA: Diagnosis not present

## 2023-05-05 MED ORDER — TRAMADOL HCL 50 MG PO TABS
50.0000 mg | ORAL_TABLET | Freq: Four times a day (QID) | ORAL | 0 refills | Status: AC | PRN
Start: 2023-05-05 — End: ?

## 2023-05-05 MED ORDER — DOXYCYCLINE HYCLATE 100 MG PO CAPS: 100.0000 mg | ORAL_CAPSULE | Freq: Two times a day (BID) | ORAL | 0 refills | Status: AC

## 2023-05-05 MED ORDER — PREDNISONE 20 MG PO TABS: 40.0000 mg | ORAL_TABLET | Freq: Every day | ORAL | 0 refills | Status: AC

## 2023-05-05 NOTE — Discharge Instructions (Addendum)
Today you have been evaluated for your abscess, as it is already draining and the site is still firm will not need to be opened further  Take doxycycline every morning and every evening for 7 days to clear infection  Hold warm-hot compresses to affected area at least 4 times a day, this helps to facilitate draining, the more the better  Please return for evaluation for increased swelling, increased tenderness or pain, non healing site, non draining site, you begin to have fever or chills   For your knee  You have been given knee sleeve for compression wear whenever completing activities to add stability and support  Tomorrow take prednisone every morning with food as directed to reduce inflammation and help with pain, may use Tylenol while taking this medicine  For severe pain you may use tramadol every 6 hours, please be mindful of this can make you feel sleepy  Make a follow-up appointment with orthopedic specialist for further evaluation and management of your symptoms  Elevate whenever sitting and lying to help reduce swelling and for general comfort  You may massage and stretch as tolerated

## 2023-05-05 NOTE — ED Triage Notes (Signed)
Pt presents with with abscess to right arm/elbow that started 3-4 days ago. Knee pain x 1 day, pt states her knee bone is out of place and needs surgery but would having a hard time walking and pain.

## 2023-05-05 NOTE — ED Provider Notes (Signed)
Renaldo Fiddler    CSN: [id number] Arrival date & time: 05/05/23  1708      History   Chief Complaint Chief Complaint  Patient presents with   Abscess   Knee Pain    HPI Sharon Walton is a 52 y.o. female.   Patient presents for evaluation of an abscess to the right forearm beginning 2 days ago.  Has been soaking in warm Epsom salt and site is beginning to drain.  Skin is hot to touch and swollen and tender.  Denies presence of fever.  Endorses that arm was bitten by far it approximately 1 week ago.  Endorses pain and swelling to the right knee beginning 1 day ago.  Has chronic pain to the site, endorses a floating bone that pops in and out of place, able to put it back in place upon arrival to clinic.  Has limited range of motion unable to fully straighten or bend the knee.  Has become painful to bear weight but able to do so.  Has been wearing compression for stability and support.  Recently got insurance and is attempting to schedule follow-up with orthopedic specialist as she knows she needs surgery.  Denies numbness or tingling.  Has not attempted pharmacological treatment.  Past Medical History:  Diagnosis Date   Arthritis    Attention deficit disorder (ADD)    Bipolar disorder (HCC)    Depression    Hypertension    Panic attacks     Patient Active Problem List   Diagnosis Date Noted   Spondylolisthesis, lumbar region 11/11/2021    Class: Chronic   Amphetamine use disorder, moderate, dependence (HCC) 11/11/2021   Benzodiazepine abuse (HCC) 11/11/2021   Spinal stenosis of lumbar region 09/26/2021   Unilateral primary osteoarthritis, right knee 07/31/2021   History of anxiety 07/23/2021   Essential hypertension 05/01/2021   Muscle spasm of back 04/03/2021   Fleshy skin mole 04/03/2021   History of peripheral neuropathy 01/14/2021   Methamphetamine abuse (HCC) 12/24/2020   Homelessness 12/24/2020   No-show for appointment 06/04/2020   History of  substance use 05/15/2020   Encounter to establish care 01/25/2020   Chronic low back pain 01/25/2020   Chronic pain of right knee 01/25/2020   Depressive disorder 11/13/2019    Past Surgical History:  Procedure Laterality Date   ABDOMINAL HYSTERECTOMY     CHOLECYSTECTOMY      OB History   No obstetric history on file.      Home Medications    Prior to Admission medications   Medication Sig Start Date End Date Taking? Authorizing Provider  doxycycline (VIBRAMYCIN) 100 MG capsule Take 1 capsule (100 mg total) by mouth 2 (two) times daily. 05/05/23  Yes Madelina Sanda R, NP  predniSONE (DELTASONE) 20 MG tablet Take 2 tablets (40 mg total) by mouth daily. 05/05/23  Yes Makaio Mach, Elita Boone, NP  traMADol (ULTRAM) 50 MG tablet Take 1 tablet (50 mg total) by mouth every 6 (six) hours as needed. 05/05/23  Yes Fischer Halley R, NP  gabapentin (NEURONTIN) 300 MG capsule Take 1 capsule (300 mg total) by mouth 3 (three) times daily. 02/05/22   Kerrin Champagne, MD  losartan (COZAAR) 25 MG tablet Take 1 tablet (25 mg total) by mouth once daily. 12/02/21   Iloabachie, Chioma E, NP  Menthol (ICY HOT) 5 % PTCH Apply 1 application. topically daily as needed (Back pain).    [provider]  Menthol, Topical Analgesic, (BIOFREEZE) 4 % GEL Apply  1 application. topically daily as needed (back pain).    [provider]  methocarbamol (ROBAXIN) 500 MG tablet Take 1 tablet (500 mg total) by mouth once every 8 (eight) hours as needed for muscle spasms. 01/01/22   Kerrin Champagne, MD  OMEPRAZOLE PO Take 1 capsule by mouth 2 (two) times daily as needed (Heartburn).    [provider]  polyethylene glycol (GOLYTELY) 236 g solution Prepare according to package instructions. Starting at 5:00 PM: Drink one 8 oz glass of mixture every 15 minutes until you finish half of the jug. 5 hours prior to procedure, drink 8 oz glass of mixture every 15 minutes until all gone. Make sure you do not drink  anything 4 hours prior to your procedure. 10/07/21   Wyline Mood, MD    Family History Family History  Problem Relation Age of Onset   Diabetes Mother    Hypertension Mother    Cancer Father    Lupus Sister    Aneurysm Sister    SIDS Sister     Social History Social History   Tobacco Use   Smoking status: Every Day    Current packs/day: 0.13    Average packs/day: 0.1 packs/day for 25.0 years (3.3 ttl pk-yrs)    Types: Cigarettes   Smokeless tobacco: Never   Tobacco comments:    Patient smokes 3 cigarettes  per day and vapes. Patient is homeless (since 09/2020) and lives in her car. Patient trying to quit smoking.  Vaping Use   Vaping status: Every Day   Substances: Nicotine, CBD, Flavoring  Substance Use Topics   Alcohol use: No   Drug use: Not Currently    Types: Marijuana, "Crack" cocaine    Comment: used as a teenager     Allergies   Morphine   Review of Systems Review of Systems   Physical Exam Triage Vital Signs ED Triage Vitals  Encounter Vitals Group     BP 05/05/23 1736 130/80     Systolic BP Percentile --      Diastolic BP Percentile --      Pulse Rate 05/05/23 1736 98     Resp 05/05/23 1736 18     Temp 05/05/23 1736 99.2 F (37.3 C)     Temp Source 05/05/23 1736 Oral     SpO2 05/05/23 1736 97 %     Weight --      Height --      Head Circumference --      Peak Flow --      Pain Score 05/05/23 1740 7     Pain Loc --      Pain Education --      Exclude from Growth Chart --    No data found.  Updated Vital Signs BP 130/80 (BP Location: Left Arm)   Pulse 98   Temp 99.2 F (37.3 C) (Oral)   Resp 18   SpO2 97%   Visual Acuity Right Eye Distance:   Left Eye Distance:   Bilateral Distance:    Right Eye Near:   Left Eye Near:    Bilateral Near:     Physical Exam Constitutional:      Appearance: Normal appearance.  Eyes:     Extraocular Movements: Extraocular movements intact.  Pulmonary:     Effort: Pulmonary effort is normal.   Musculoskeletal:     Comments: Has generalized swelling today anterior right knee, not tender to palpation, limited range of motion unable to fully extend or  bend, able to bear weight but pain is elicited when doing so, 2+ popliteal pulse  Skin:    Comments: 2 x 3 abscess present to the posterior right forearm with scant drainage noted, surrounding erythema with moderate swelling, tender to palpation, 2+ brachial pulse  Neurological:     Mental Status: She is alert and oriented to person, place, and time. Mental status is at baseline.      UC Treatments / Results  Labs (all labs ordered are listed, but only abnormal results are displayed) Labs Reviewed - No data to display  EKG   Radiology No results found.  Procedures Procedures (including critical care time)  Medications Ordered in UC Medications - No data to display  Initial Impression / Assessment and Plan / UC Course  I have reviewed the triage vital signs and the nursing notes.  Pertinent labs & imaging results that were available during my care of the patient were reviewed by me and considered in my medical decision making (see chart for details).  Abscess to the right forearm, acute pain of right knee  Presentation of the arm is consistent with infection, abscess noted on exam, site currently draining therefore I&D deferred, prescribed doxycycline and recommended warm compresses continued to help facilitate drainage of for comfort, advise follow-up for nonhealing site  Presentation of the knee shows swelling, no new injury and therefore imaging deferred, new compression given as patient's historian to be used for stability and support as needed, prescribed prednisone and tramadol, PDMP reviewed, low risk, recommended RICE with activity as tolerated advised orthopedic follow-up for further management Final Clinical Impressions(s) / UC Diagnoses   Final diagnoses:  Acute pain of right knee  Abscess of right forearm      Discharge Instructions      Today you have been evaluated for your abscess, as it is already draining and the site is still firm will not need to be opened further  Take doxycycline every morning and every evening for 7 days to clear infection  Hold warm-hot compresses to affected area at least 4 times a day, this helps to facilitate draining, the more the better  Please return for evaluation for increased swelling, increased tenderness or pain, non healing site, non draining site, you begin to have fever or chills   For your knee  You have been given knee sleeve for compression wear whenever completing activities to add stability and support  Tomorrow take prednisone every morning with food as directed to reduce inflammation and help with pain, may use Tylenol while taking this medicine  For severe pain you may use tramadol every 6 hours, please be mindful of this can make you feel sleepy  Make a follow-up appointment with orthopedic specialist for further evaluation and management of your symptoms  Elevate whenever sitting and lying to help reduce swelling and for general comfort  You may massage and stretch as tolerated    ED Prescriptions     Medication Sig Dispense Auth. Provider   predniSONE (DELTASONE) 20 MG tablet Take 2 tablets (40 mg total) by mouth daily. 10 tablet Salli Quarry R, NP   doxycycline (VIBRAMYCIN) 100 MG capsule Take 1 capsule (100 mg total) by mouth 2 (two) times daily. 14 capsule Johnasia Liese R, NP   traMADol (ULTRAM) 50 MG tablet Take 1 tablet (50 mg total) by mouth every 6 (six) hours as needed. 12 tablet Tamica Covell, Elita Boone, NP      I have reviewed the PDMP during this  encounter.   Valinda Hoar, NP 05/05/23 [phone]
